# Patient Record
Sex: Female | Born: 1980 | Race: Black or African American | Hispanic: No | Smoking: Current every day smoker
Health system: Southern US, Community
[De-identification: ages and names within clinical notes are randomized; demographics above are authoritative.]

## PROBLEM LIST (undated history)

## (undated) DIAGNOSIS — R569 Unspecified convulsions: Secondary | ICD-10-CM

## (undated) DIAGNOSIS — D219 Benign neoplasm of connective and other soft tissue, unspecified: Secondary | ICD-10-CM

## (undated) DIAGNOSIS — N200 Calculus of kidney: Secondary | ICD-10-CM

## (undated) HISTORY — PX: TUBAL LIGATION: SHX77

## (undated) HISTORY — PX: KIDNEY STONE SURGERY: SHX686

## (undated) HISTORY — PX: INGUINAL HERNIA REPAIR: SHX194

---

## 2014-06-26 ENCOUNTER — Encounter (HOSPITAL_BASED_OUTPATIENT_CLINIC_OR_DEPARTMENT_OTHER): Payer: Self-pay

## 2014-06-26 ENCOUNTER — Emergency Department (HOSPITAL_BASED_OUTPATIENT_CLINIC_OR_DEPARTMENT_OTHER)
Admission: EM | Admit: 2014-06-26 | Discharge: 2014-06-26 | Disposition: A | Discharge status: Home / Self Care | Payer: Medicaid Other | Attending: Emergency Medicine | Admitting: Emergency Medicine

## 2014-06-26 ENCOUNTER — Emergency Department (HOSPITAL_BASED_OUTPATIENT_CLINIC_OR_DEPARTMENT_OTHER): Payer: Medicaid Other

## 2014-06-26 DIAGNOSIS — Z3202 Encounter for pregnancy test, result negative: Secondary | ICD-10-CM | POA: Insufficient documentation

## 2014-06-26 DIAGNOSIS — R112 Nausea with vomiting, unspecified: Secondary | ICD-10-CM | POA: Diagnosis not present

## 2014-06-26 DIAGNOSIS — Z86018 Personal history of other benign neoplasm: Secondary | ICD-10-CM | POA: Insufficient documentation

## 2014-06-26 DIAGNOSIS — B9689 Other specified bacterial agents as the cause of diseases classified elsewhere: Secondary | ICD-10-CM

## 2014-06-26 DIAGNOSIS — R1110 Vomiting, unspecified: Secondary | ICD-10-CM

## 2014-06-26 DIAGNOSIS — N3941 Urge incontinence: Secondary | ICD-10-CM

## 2014-06-26 DIAGNOSIS — N760 Acute vaginitis: Secondary | ICD-10-CM | POA: Insufficient documentation

## 2014-06-26 DIAGNOSIS — Z720 Tobacco use: Secondary | ICD-10-CM | POA: Insufficient documentation

## 2014-06-26 DIAGNOSIS — Z87442 Personal history of urinary calculi: Secondary | ICD-10-CM | POA: Insufficient documentation

## 2014-06-26 DIAGNOSIS — M545 Low back pain: Secondary | ICD-10-CM | POA: Insufficient documentation

## 2014-06-26 DIAGNOSIS — R1111 Vomiting without nausea: Secondary | ICD-10-CM

## 2014-06-26 HISTORY — DX: Calculus of kidney: N20.0

## 2014-06-26 HISTORY — DX: Benign neoplasm of connective and other soft tissue, unspecified: D21.9

## 2014-06-26 LAB — CBC WITH DIFFERENTIAL/PLATELET
BASOS PCT: 1 % (ref 0–1)
Basophils Absolute: 0 10*3/uL (ref 0.0–0.1)
Eosinophils Absolute: 0.1 10*3/uL (ref 0.0–0.7)
Eosinophils Relative: 1 % (ref 0–5)
HCT: 42.8 % (ref 36.0–46.0)
HEMOGLOBIN: 14.3 g/dL (ref 12.0–15.0)
Lymphocytes Relative: 27 % (ref 12–46)
Lymphs Abs: 1.8 10*3/uL (ref 0.7–4.0)
MCH: 29.6 pg (ref 26.0–34.0)
MCHC: 33.4 g/dL (ref 30.0–36.0)
MCV: 88.6 fL (ref 78.0–100.0)
Monocytes Absolute: 0.5 10*3/uL (ref 0.1–1.0)
Monocytes Relative: 7 % (ref 3–12)
NEUTROS ABS: 4.1 10*3/uL (ref 1.7–7.7)
NEUTROS PCT: 64 % (ref 43–77)
PLATELETS: 185 10*3/uL (ref 150–400)
RBC: 4.83 MIL/uL (ref 3.87–5.11)
RDW: 13.9 % (ref 11.5–15.5)
WBC: 6.5 10*3/uL (ref 4.0–10.5)

## 2014-06-26 LAB — PREGNANCY, URINE: Preg Test, Ur: NEGATIVE

## 2014-06-26 LAB — COMPREHENSIVE METABOLIC PANEL
ALT: 18 U/L (ref 0–35)
AST: 23 U/L (ref 0–37)
Albumin: 4 g/dL (ref 3.5–5.2)
Alkaline Phosphatase: 46 U/L (ref 39–117)
Anion gap: 6 (ref 5–15)
BUN: 14 mg/dL (ref 6–23)
CALCIUM: 8.8 mg/dL (ref 8.4–10.5)
CO2: 26 mmol/L (ref 19–32)
Chloride: 103 mmol/L (ref 96–112)
Creatinine, Ser: 0.84 mg/dL (ref 0.50–1.10)
GFR calc non Af Amer: >90 mL/min (ref >90)
GLUCOSE: 117 mg/dL — AB (ref 70–99)
Potassium: 4.1 mmol/L (ref 3.5–5.1)
Sodium: 135 mmol/L (ref 135–145)
TOTAL PROTEIN: 7.5 g/dL (ref 6.0–8.3)
Total Bilirubin: 0.9 mg/dL (ref 0.3–1.2)

## 2014-06-26 LAB — WET PREP, GENITAL
Trich, Wet Prep: NONE SEEN
Yeast Wet Prep HPF POC: NONE SEEN

## 2014-06-26 LAB — URINALYSIS, ROUTINE W REFLEX MICROSCOPIC
Glucose, UA: NEGATIVE mg/dL
Hgb urine dipstick: NEGATIVE
Ketones, ur: 15 mg/dL — AB
Leukocytes, UA: NEGATIVE
NITRITE: NEGATIVE
PH: 6 (ref 5.0–8.0)
Protein, ur: NEGATIVE mg/dL
Specific Gravity, Urine: 1.026 (ref 1.005–1.030)
Urobilinogen, UA: 1 mg/dL (ref 0.0–1.0)

## 2014-06-26 LAB — LIPASE, BLOOD: Lipase: 31 U/L (ref 11–59)

## 2014-06-26 MED ORDER — MORPHINE SULFATE 4 MG/ML IJ SOLN
4.0000 mg | INTRAMUSCULAR | Status: DC | PRN
  Administered 2014-06-26: 4 mg via INTRAVENOUS
  Filled 2014-06-26: qty 1

## 2014-06-26 MED ORDER — ONDANSETRON HCL 4 MG PO TABS: 4.0000 mg | ORAL_TABLET | Freq: Three times a day (TID) | ORAL | Status: DC | PRN

## 2014-06-26 MED ORDER — METRONIDAZOLE 500 MG PO TABS
500.0000 mg | ORAL_TABLET | Freq: Two times a day (BID) | ORAL | Status: DC
Start: 2014-06-26 — End: 2014-09-13

## 2014-06-26 MED ORDER — SODIUM CHLORIDE 0.9 % IV BOLUS (SEPSIS)
1000.0000 mL | Freq: Once | INTRAVENOUS | Status: AC
  Administered 2014-06-26: 1000 mL via INTRAVENOUS

## 2014-06-26 MED ORDER — ONDANSETRON HCL 4 MG/2ML IJ SOLN
4.0000 mg | Freq: Once | INTRAMUSCULAR | Status: AC
  Administered 2014-06-26: 4 mg via INTRAVENOUS
  Filled 2014-06-26: qty 2

## 2014-06-26 MED ORDER — FAMOTIDINE IN NACL 20-0.9 MG/50ML-% IV SOLN
20.0000 mg | Freq: Once | INTRAVENOUS | Status: AC
  Administered 2014-06-26: 20 mg via INTRAVENOUS
  Filled 2014-06-26: qty 50

## 2014-06-26 MED ORDER — HYDROCODONE-ACETAMINOPHEN 5-325 MG PO TABS: ORAL_TABLET | ORAL | Status: DC

## 2014-06-26 MED ORDER — METRONIDAZOLE 500 MG PO TABS
500.0000 mg | ORAL_TABLET | Freq: Once | ORAL | Status: AC
  Administered 2014-06-26: 500 mg via ORAL
  Filled 2014-06-26: qty 1

## 2014-06-26 NOTE — ED Provider Notes (Signed)
CSN: [id number]     Arrival date & time 06/26/14  1110 History   First MD Initiated Contact with Patient 06/26/14 1122     Chief Complaint  Patient presents with  . Emesis     (Consider location/radiation/quality/duration/timing/severity/associated sxs/prior Treatment) HPI   Tracey French is a 34 y.o. female complaining of episodes of nonbloody, nonbilious, non-coffee ground emesis starting yesterday in addition to urinary frequency, patient states that she caught to urine 3 times this morning. She states that she had the sensation of the urge to void but she couldn't get there in time. These had this issue with dribbling urine in the past but she's never completely lost control of her bladder before today. She has a history of fibroids, she had one surgery. She was advised that she would need to have a hysterectomy secondary to pressure that the the uterus was excerting on the pelvic floor. 3 vaginal deliveries with no other abdominal surgeries. Patient reports tactile fever last night however she took her temperature and it was normal. She is having normal bowel movements. On review of systems patient notes a severe back pain worsening over the course of one month, she states that she brought a new mattress and has been getting massages because the pain is so severe, she rates at 10 out of 10. It is nonradiating. Patient states that she just attributed to lifting and bending at work. She denies any history of IV drug use or cancer, saddle anesthesia.  Past Medical History  Diagnosis Date  . Fibroids   . Kidney stone    Past Surgical History  Procedure Laterality Date  . Kidney stone surgery    . Tubal ligation     No family history on file. History  Substance Use Topics  . Smoking status: Current Every Day Smoker  . Smokeless tobacco: Not on file  . Alcohol Use: No   OB History    No data available     Review of Systems  10 systems reviewed and found to be negative, except as  noted in the HPI.   Allergies  Codeine  Home Medications   Prior to Admission medications   Medication Sig Start Date End Date Taking? Authorizing Provider  HYDROcodone-acetaminophen (NORCO/VICODIN) 5-325 MG per tablet Take 1-2 tablets by mouth every 6 hours as needed for pain and/or cough. 06/26/14   Livier Hendel, PA-C  metroNIDAZOLE (FLAGYL) 500 MG tablet Take 1 tablet (500 mg total) by mouth 2 (two) times daily. One tab PO bid x 14 days 06/26/14   Elmyra Ricks Cailyn Houdek, PA-C  ondansetron (ZOFRAN) 4 MG tablet Take 1 tablet (4 mg total) by mouth every 8 (eight) hours as needed for nausea or vomiting. 06/26/14   Raiana Pharris, PA-C   BP 110/61 mmHg  Pulse 62  Temp(Src) 98.5 F (36.9 C) (Oral)  Resp 18  Ht 5\' 6"  (1.676 m)  Wt 128 lb (58.06 kg)  BMI 20.67 kg/m2  SpO2 100%  LMP 06/06/2014 (Approximate) Physical Exam  Constitutional: She is oriented to person, place, and time. She appears well-developed and well-nourished. No distress.  HENT:  Head: Normocephalic and atraumatic.  Mouth/Throat: Oropharynx is clear and moist.  Eyes: Conjunctivae and EOM are normal. Pupils are equal, round, and reactive to light.  Neck: Normal range of motion.  Cardiovascular: Normal rate, regular rhythm and intact distal pulses.   Pulmonary/Chest: Effort normal and breath sounds normal. No stridor. No respiratory distress. She has no wheezes. She has no rales. She exhibits  no tenderness.  Abdominal: Soft. Bowel sounds are normal. She exhibits no distension and no mass. There is no tenderness. There is no rebound and no guarding.  Genitourinary:  Pelvic exam is chaperoned by technician:   No rashes or lesions, no saddle anesthesia, patient has profuse thin, homogenous yellowish discharge. No cervical motion or adnexal tenderness.    Musculoskeletal: Normal range of motion. She exhibits no edema or tenderness.  No point tenderness to percussion of lumbar spinal processes.  No TTP or paraspinal  muscular spasm. Strength is 5 out of 5 to bilateral lower extremities at hip and knee; extensor hallucis longus 5 out of 5. Ankle strength 5 out of 5, no clonus, neurovascularly intact. No saddle anaesthesia. Patellar reflexes are 2+ bilaterally.    Rectal Tone normal.  Patient ambulates with a coordinated and nonantalgic gait.   Neurological: She is alert and oriented to person, place, and time.  Psychiatric: She has a normal mood and affect.  Nursing note and vitals reviewed.   ED Course  Procedures (including critical care time) Labs Review Labs Reviewed  WET PREP, GENITAL - Abnormal; Notable for the following:    Clue Cells Wet Prep HPF POC MODERATE (*)    WBC, Wet Prep HPF POC FEW (*)    All other components within normal limits  URINALYSIS, ROUTINE W REFLEX MICROSCOPIC - Abnormal; Notable for the following:    Bilirubin Urine SMALL (*)    Ketones, ur 15 (*)    All other components within normal limits  COMPREHENSIVE METABOLIC PANEL - Abnormal; Notable for the following:    Glucose, Bld 117 (*)    All other components within normal limits  PREGNANCY, URINE  CBC WITH DIFFERENTIAL/PLATELET  LIPASE, BLOOD  GC/CHLAMYDIA PROBE AMP ([place])    Imaging Review US Transvaginal Non-ob  06/26/2014   CLINICAL DATA:  Pelvic pain, history of fibroid uterus  EXAM: TRANSABDOMINAL AND TRANSVAGINAL ULTRASOUND OF PELVIS  TECHNIQUE: Both transabdominal and transvaginal ultrasound examinations of the pelvis were performed. Transabdominal technique was performed for global imaging of the pelvis including uterus, ovaries, adnexal regions, and pelvic cul-de-sac. It was necessary to proceed with endovaginal exam following the transabdominal exam to visualize the ovaries.  COMPARISON:  None  FINDINGS: Uterus  Measurements: 10 x 5.8 x 7 cm. No fibroids or other mass visualized.  Endometrium  Thickness: 7 mm.  No focal abnormality visualized.  Right ovary  Measurements: 3.1 x 2.1 x 2.7 cm. Normal  appearance/no adnexal mass.  Left ovary  Measurements: 3.8 x 2.8 x 2.5 cm. Normal appearance/no adnexal mass.  Other findings  No free fluid.  IMPRESSION: No acute abnormality noted.   Electronically Signed   By: Inez Catalina M.D.   On: 06/26/2014 12:51   US Pelvis Complete  06/26/2014   CLINICAL DATA:  Pelvic pain, history of fibroid uterus  EXAM: TRANSABDOMINAL AND TRANSVAGINAL ULTRASOUND OF PELVIS  TECHNIQUE: Both transabdominal and transvaginal ultrasound examinations of the pelvis were performed. Transabdominal technique was performed for global imaging of the pelvis including uterus, ovaries, adnexal regions, and pelvic cul-de-sac. It was necessary to proceed with endovaginal exam following the transabdominal exam to visualize the ovaries.  COMPARISON:  None  FINDINGS: Uterus  Measurements: 10 x 5.8 x 7 cm. No fibroids or other mass visualized.  Endometrium  Thickness: 7 mm.  No focal abnormality visualized.  Right ovary  Measurements: 3.1 x 2.1 x 2.7 cm. Normal appearance/no adnexal mass.  Left ovary  Measurements: 3.8 x 2.8 x  2.5 cm. Normal appearance/no adnexal mass.  Other findings  No free fluid.  IMPRESSION: No acute abnormality noted.   Electronically Signed   By: Inez Catalina M.D.   On: 06/26/2014 12:51     EKG Interpretation None      MDM   Final diagnoses:  Urge incontinence of urine  Non-intractable vomiting without nausea, vomiting of unspecified type  Low back pain, unspecified back pain laterality, with sciatica presence unspecified  Bacterial vaginosis    Filed Vitals:   06/26/14 1120 06/26/14 1440  BP: 111/71 110/61  Pulse: 70 62  Temp: 98.5 F (36.9 C)   TempSrc: Oral   Resp: 16 18  Height: 5\' 6"  (1.676 m)   Weight: 128 lb (58.06 kg)   SpO2: 96% 100%    Medications  morphine 4 MG/ML injection 4 mg (4 mg Intravenous Given 06/26/14 1320)  metroNIDAZOLE (FLAGYL) tablet 500 mg (not administered)  ondansetron (ZOFRAN) injection 4 mg (4 mg Intravenous Given 06/26/14  1134)  famotidine (PEPCID) IVPB 20 mg (0 mg Intravenous Stopped 06/26/14 1210)  sodium chloride 0.9 % bolus 1,000 mL (1,000 mLs Intravenous New Bag/Given 06/26/14 1319)    Tracey French is a pleasant 34 y.o. female presenting with several episodes of emesis, states that she not able to make it to the restroom and that she urinated on herself 3 times this morning. Neurologic exam is without abnormality, normal rectal tone, no saddle anesthesia. I doubt this is a cauda equina. Discussed case with attending physician who agrees, no MRI is warranted at this time. Patient has history of fibroids and was told that they are exerting pressure on the bladder. Patient did have the sensation that she needed to urinate. Patient reports urinary frequency, urinalysis is without signs of infection. Pelvic ultrasound with no acute abnormalities. Pelvic exam performed after patient returned from ultrasound and patient is reporting a perennial burning sensation. I don't see any lesions. No cervical motion or adnexal tenderness, patient does have a profuse homogenous yellow vaginal discharge. Clue cells are seen, I will treat her with Flagyl.  Postvoid residual <30cc Pt passed PO challenge.   Discussed case with attending MD who agrees with plan and stability to d/c to home.    Evaluation does not show pathology that would require ongoing emergent intervention or inpatient treatment. Pt is hemodynamically stable and mentating appropriately. Discussed findings and plan with patient/guardian, who agrees with care plan. All questions answered. Return precautions discussed and outpatient follow up given.   New Prescriptions   HYDROCODONE-ACETAMINOPHEN (NORCO/VICODIN) 5-325 MG PER TABLET    Take 1-2 tablets by mouth every 6 hours as needed for pain and/or cough.   METRONIDAZOLE (FLAGYL) 500 MG TABLET    Take 1 tablet (500 mg total) by mouth 2 (two) times daily. One tab PO bid x 14 days   ONDANSETRON (ZOFRAN) 4 MG TABLET     Take 1 tablet (4 mg total) by mouth every 8 (eight) hours as needed for nausea or vomiting.         Monico Blitz, PA-C 06/26/14 1442  Evelina Bucy, MD 06/26/14 1455

## 2014-06-26 NOTE — Discharge Instructions (Signed)
Push fluids: take small frequent sips of water or Gatorade, do not drink any soda, juice or caffeinated beverages.    Slowly resume solid diet as desired. Avoid food that are spicy, contain dairy and/or have high fat content.  Take vicodin for breakthrough pain, do not drink alcohol, drive, care for children or do other critical tasks while taking vicodin.  Do not hesitate to return to the emergency room for any new, worsening or concerning symptoms.  Please obtain primary care using resource guide below. But the minute you were seen in the emergency room and that they will need to obtain records for further outpatient management.   Back Pain, Adult Back pain is very common. The pain often gets better over time. The cause of back pain is usually not dangerous. Most people can learn to manage their back pain on their own.  HOME CARE   Stay active. Start with short walks on flat ground if you can. Try to walk farther each day.  Do not sit, drive, or stand in one place for more than 30 minutes. Do not stay in bed.  Do not avoid exercise or work. Activity can help your back heal faster.  Be careful when you bend or lift an object. Bend at your knees, keep the object close to you, and do not twist.  Sleep on a firm mattress. Lie on your side, and bend your knees. If you lie on your back, put a pillow under your knees.  Only take medicines as told by your doctor.  Put ice on the injured area.  Put ice in a plastic bag.  Place a towel between your skin and the bag.  Leave the ice on for 15-20 minutes, 03-04 times a day for the first 2 to 3 days. After that, you can switch between ice and heat packs.  Ask your doctor about back exercises or massage.  Avoid feeling anxious or stressed. Find good ways to deal with stress, such as exercise. GET HELP RIGHT AWAY IF:   Your pain does not go away with rest or medicine.  Your pain does not go away in 1 week.  You have new problems.  You  do not feel well.  The pain spreads into your legs.  You cannot control when you poop (bowel movement) or pee (urinate).  Your arms or legs feel weak or lose feeling (numbness).  You feel sick to your stomach (nauseous) or throw up (vomit).  You have belly (abdominal) pain.  You feel like you may pass out (faint). MAKE SURE YOU:   Understand these instructions.  Will watch your condition.  Will get help right away if you are not doing well or get worse. Document Released: 08/13/2007 Document Revised: 05/19/2011 Document Reviewed: 06/28/2013 [facility] Patient Information 2015 [place], [place]. This information is not intended to replace advice given to you by your health care provider. Make sure you discuss any questions you have with your health care provider.  Bacterial Vaginosis Bacterial vaginosis is a vaginal infection that occurs when the normal balance of bacteria in the vagina is disrupted. It results from an overgrowth of certain bacteria. This is the most common vaginal infection in women of childbearing age. Treatment is important to prevent complications, especially in pregnant women, as it can cause a premature delivery. CAUSES  Bacterial vaginosis is caused by an increase in harmful bacteria that are normally present in smaller amounts in the vagina. Several different kinds of bacteria can cause bacterial vaginosis.  However, the reason that the condition develops is not fully understood. RISK FACTORS Certain activities or behaviors can put you at an increased risk of developing bacterial vaginosis, including:  Having a new sex partner or multiple sex partners.  Douching.  Using an intrauterine device (IUD) for contraception. Women do not get bacterial vaginosis from toilet seats, bedding, swimming pools, or contact with objects around them. SIGNS AND SYMPTOMS  Some women with bacterial vaginosis have no signs or symptoms. Common symptoms include:  Grey vaginal  discharge.  A fishlike odor with discharge, especially after sexual intercourse.  Itching or burning of the vagina and vulva.  Burning or pain with urination. DIAGNOSIS  Your health care provider will take a medical history and examine the vagina for signs of bacterial vaginosis. A sample of vaginal fluid may be taken. Your health care provider will look at this sample under a microscope to check for bacteria and abnormal cells. A vaginal pH test may also be done.  TREATMENT  Bacterial vaginosis may be treated with antibiotic medicines. These may be given in the form of a pill or a vaginal cream. A second round of antibiotics may be prescribed if the condition comes back after treatment.  HOME CARE INSTRUCTIONS   Only take over-the-counter or prescription medicines as directed by your health care provider.  If antibiotic medicine was prescribed, take it as directed. Make sure you finish it even if you start to feel better.  Do not have sex until treatment is completed.  Tell all sexual partners that you have a vaginal infection. They should see their health care provider and be treated if they have problems, such as a mild rash or itching.  Practice safe sex by using condoms and only having one sex partner. SEEK MEDICAL CARE IF:   Your symptoms are not improving after 3 days of treatment.  You have increased discharge or pain.  You have a fever. MAKE SURE YOU:   Understand these instructions.  Will watch your condition.  Will get help right away if you are not doing well or get worse. FOR MORE INFORMATION  Centers for Disease Control and Prevention, Division of STD Prevention: AppraiserFraud.fi American Sexual Health Association (ASHA): www.ashastd.org  Document Released: 02/24/2005 Document Revised: 12/15/2012 Document Reviewed: 10/06/2012 [facility] Patient Information 2015 [place], [place]. This information is not intended to replace advice given to you by your health care  provider. Make sure you discuss any questions you have with your health care provider.   Emergency Department Resource Guide 1) Find a Doctor and Pay Out of Pocket Although you won't have to find out who is covered by your insurance plan, it is a good idea to ask around and get recommendations. You will then need to call the office and see if the doctor you have chosen will accept you as a new patient and what types of options they offer for patients who are self-pay. Some doctors offer discounts or will set up payment plans for their patients who do not have insurance, but you will need to ask so you aren't surprised when you get to your appointment.  2) Contact Your Local Health Department Not all health departments have doctors that can see patients for sick visits, but many do, so it is worth a call to see if yours does. If you don't know where your local health department is, you can check in your phone book. The CDC also has a tool to help you locate your state's health  department, and many state websites also have listings of all of their local health departments.  3) Find a [facility] If your illness is not likely to be very severe or complicated, you may want to try a walk in clinic. These are popping up all over the country in pharmacies, drugstores, and shopping centers. They're usually staffed by nurse practitioners or physician assistants that have been trained to treat common illnesses and complaints. They're usually fairly quick and inexpensive. However, if you have serious medical issues or chronic medical problems, these are probably not your best option.  No Primary Care Doctor: - Call Health Connect at  [phone] - they can help you locate a primary care doctor that  accepts your insurance, provides certain services, etc. - Physician Referral Service- [phone]  Chronic Pain Problems: Organization         Address  Phone   Notes  [facility]  [phone] Patients need to be referred by their primary care doctor.   Medication Assistance: Organization         Address  Phone   Notes  [facility] Medication [facility] [address] [place], [place] [address] [phone] --Must be a resident of [facility] -- Must have NO insurance coverage whatsoever (no Medicaid/ Medicare, etc.) -- The pt. MUST have a primary care doctor that directs their care regularly and follows them in the community   MedAssist  [phone]   [employer]  [phone]    Agencies that provide inexpensive medical care: Organization         Address  Phone   Notes  [place]  [phone]   [facility]    [phone]   [facility] [place], [place] [address] [phone]   [place] [address], [place] [phone]   Planned Parenthood    [phone]   [facility]    [phone]   [place] and [place] [address], [place] Phone:  [phone], Fax:  [phone] Hours of Operation:  9 am - 6 pm, M-F.  Also accepts Medicaid/Medicare and self-pay.  [facility] for [place] [address], [address], [place] Phone: [phone], Fax: [phone]. Hours of Operation:  8:30 am - 5:30 pm, M-F.  Also accepts Medicaid and self-pay.  [facility] [place] [address], [place] Phone: [phone]   [place], [place], [place] [phone], Ext. [phone] Mondays & Thursdays: 7-9 AM.  First 15 patients are seen on a first come, first serve basis.    [place] Providers:  Organization         Address  Phone   Notes  [facility] [address], [place] [phone] Also accepts self-pay patients.  [facility] [address], [place]   [phone]   [place], [address], [place] [phone]   [facility] [address] [place] [phone]   Lucianne Lei [address], [place]   [phone] Only accepts [place] Access [place] patients after they have their name applied to their card.   Self-Pay (no insurance) in [facility]:  Organization  Address  Phone   Notes  Sickle Cell Patients, [facility] [place] [phone]   [facility] Urgent Care [place] [phone]   [facility] [address], [address], [place] [phone]   [facility]/Dr. Osei-Bonsu  [address] [place] or [address], [place] [phone] Phone number for both [place] and [place] locations is the same.  Urgent Medical and [facility] [address], [place] [phone]   [facility] [address] [place] or [address] [phone]   [facility] [address] [place] [phone], phone; [phone], fax Sees patients 1st and 3rd Saturday of every month.  Must not qualify for public or private insurance (i.e. Medicaid, Medicare, [place] Health Choice, Veterans' Benefits)  Household income should be no more than 200% of the poverty level The clinic cannot treat you if you are pregnant or think you are pregnant  Sexually transmitted diseases are not treated at the clinic.    Dental Care: Organization         Address  Phone  Notes  [facility] Department of [facility] [place] [phone] Accepts children up to age 44 who are enrolled in [place] or [place]; pregnant women with a Medicaid card; and children who have applied for Medicaid or [place] Health Choice, but  were declined, whose parents can pay a reduced fee at time of service.  [facility] Department of [facility]  [address], [place] [phone] Accepts children up to age 53 who are enrolled in [place] or [place]; pregnant women with a Medicaid card; and children who have applied for Medicaid or [place] Health Choice, but were declined, whose parents can pay a reduced fee at time of service.  [place] Adult Dental Access PROGRAM  [place] [phone] Patients are seen by appointment only. Walk-ins are not accepted. [place] will see patients 35 years of age and older. Monday - Tuesday (8am-5pm) Most Wednesdays (8:30-5pm) $30 per visit, cash only  [facility] Adult Dental Access PROGRAM  [address], [facility] [phone] Patients are seen by appointment only. Walk-ins are not accepted. [place] will see patients 64 years of age and older. One Wednesday Evening (Monthly: Volunteer Based).  $30 per visit, cash only  [place]  [phone] for adults; Children under age 6, call Graduate Pediatric Dentistry at [phone]. Children aged 45-14, please call [phone] to request a pediatric application.  Dental services are provided in all areas of dental care including fillings, crowns and bridges, complete and partial dentures, implants, gum treatment, root canals, and extractions. Preventive care is also provided. Treatment is provided to both adults and children. Patients are selected via a lottery and there is often a waiting list.   [facility] [address] [place]  [phone] [email or web address]   [facility] [address] [place], [place] [phone], Ext. [phone] Second and Fourth Thursday of each month, opens at 6:30 AM; Clinic ends at 9 AM.  Patients are seen on a first-come first-served basis, and a limited number are seen during each clinic.    [facility]  [address] [place], [place]  [place], [place] [phone]   Eligibility Requirements You must have lived in [place], [place], or [place] counties for at least the last three months.   You cannot be eligible for state or federal sponsored [employer], including [employer], [place], or [employer].   You generally cannot be eligible for healthcare insurance through your employer.    How to apply: Eligibility screenings are held every Tuesday and Wednesday afternoon from 1:00 pm until 4:00 pm. You do not need an appointment for the interview!  [facility] [address] [place], [place]   [place]  [place] Department  [place]  [phone]    Behavioral Health Resources in the Community: Intensive Outpatient Programs Organization         Address  Phone  Notes  [place]. [address] [place], [place] [phone]   [facility] Outpatient [address], [place], [place]   ADS: Alcohol & Drug Svcs [address] [place] [address], [place], [place]   [place] [address],  [place], [place] or [phone]   Substance Abuse Resources Organization         Address  Phone  Notes  Alcohol and Drug Services  [phone]   [place]  [phone]   [facility] [place]   Chinita Pester  [phone]   Residential & Outpatient Substance Abuse Program  [phone]   Psychological Services Organization         Address  Phone  Notes  [facility] [place]  [place]  [phone]   [place] [address], [place] or [phone]    Mobile Crisis Teams Organization         Address  Phone  Notes  [facility], Mobile Crisis Care  Unit  [phone]   Assertive Psychotherapeutic Services  [address] [place], [place]   [place] [address] [place] [phone]    Self-Help/Support Groups Organization         Address  Phone             Notes  [place]. of [place] - variety of support groups  [place] Call for more information  Narcotics Anonymous (NA), Caring Services [address], [employer] [place]  2 meetings at this location   Special educational needs teacher         Address  Phone  Notes  ASAP Residential Treatment [place],    [place]  [phone]   [facility]  [address] [place] [address], [place], [place]   [place], [place] [phone] Admissions: 8am-3pm M-F  Incentives Substance [place] [address]    [place], [place] [phone]   [facility] [address] Jadene Pierini [place], [place]   [facility] [address]  [place], [place]   Insight Programs - Intensive Outpatient [address], [place], [place]   [facility] ([place] [address].,  [place], [place] or [phone]   [facility]idential Treatment Services (RTS) [address], [place], [place] Accepts Medicaid  [facility] [place] [address]  [place] [phone] Substance Abuse/Addiction Treatment   [facility] Resources Organization  Address  Phone  Notes  [facility]  [phone]   Domenic Schwab, PhD [address] [place], [place]   [phone] or [phone]   [place], [place] [phone]   [address], [place], [place] [phone] Insurance/Medicaid/sponsorship through [facility] Families [address] [place]                                     [place], [place] [phone] [place] [address], [place] [phone]    Dr. Adele Schilder  [phone]   [facility] [place] [facility] Dept. 1) [address], [place] 2) [place] 3)  [address], [place] [phone]  [phone]   [place] [phone] or [phone] (After Hours)

## 2014-06-26 NOTE — ED Notes (Signed)
C/o vomiting, "pressure in my bladder" with ?blood in urine

## 2014-06-26 NOTE — ED Notes (Signed)
PA at bedside.

## 2014-06-27 LAB — GC/CHLAMYDIA PROBE AMP (~~LOC~~) NOT AT ARMC
CHLAMYDIA, DNA PROBE: NEGATIVE
NEISSERIA GONORRHEA: NEGATIVE

## 2014-09-13 ENCOUNTER — Encounter (HOSPITAL_BASED_OUTPATIENT_CLINIC_OR_DEPARTMENT_OTHER): Payer: Self-pay | Admitting: *Deleted

## 2014-09-13 ENCOUNTER — Emergency Department (HOSPITAL_BASED_OUTPATIENT_CLINIC_OR_DEPARTMENT_OTHER): Payer: Medicaid Other

## 2014-09-13 ENCOUNTER — Emergency Department (HOSPITAL_BASED_OUTPATIENT_CLINIC_OR_DEPARTMENT_OTHER)
Admission: EM | Admit: 2014-09-13 | Discharge: 2014-09-13 | Disposition: A | Discharge status: Home / Self Care | Payer: Medicaid Other | Attending: Emergency Medicine | Admitting: Emergency Medicine

## 2014-09-13 DIAGNOSIS — S92531A Displaced fracture of distal phalanx of right lesser toe(s), initial encounter for closed fracture: Secondary | ICD-10-CM | POA: Insufficient documentation

## 2014-09-13 DIAGNOSIS — G40909 Epilepsy, unspecified, not intractable, without status epilepticus: Secondary | ICD-10-CM | POA: Diagnosis not present

## 2014-09-13 DIAGNOSIS — W51XXXA Accidental striking against or bumped into by another person, initial encounter: Secondary | ICD-10-CM | POA: Insufficient documentation

## 2014-09-13 DIAGNOSIS — Z87442 Personal history of urinary calculi: Secondary | ICD-10-CM | POA: Insufficient documentation

## 2014-09-13 DIAGNOSIS — S92911A Unspecified fracture of right toe(s), initial encounter for closed fracture: Secondary | ICD-10-CM

## 2014-09-13 DIAGNOSIS — Z86018 Personal history of other benign neoplasm: Secondary | ICD-10-CM | POA: Insufficient documentation

## 2014-09-13 DIAGNOSIS — Y9389 Activity, other specified: Secondary | ICD-10-CM | POA: Diagnosis not present

## 2014-09-13 DIAGNOSIS — Z720 Tobacco use: Secondary | ICD-10-CM | POA: Insufficient documentation

## 2014-09-13 DIAGNOSIS — Y998 Other external cause status: Secondary | ICD-10-CM | POA: Diagnosis not present

## 2014-09-13 DIAGNOSIS — Y9289 Other specified places as the place of occurrence of the external cause: Secondary | ICD-10-CM | POA: Diagnosis not present

## 2014-09-13 DIAGNOSIS — S99922A Unspecified injury of left foot, initial encounter: Secondary | ICD-10-CM | POA: Diagnosis present

## 2014-09-13 DIAGNOSIS — Z79899 Other long term (current) drug therapy: Secondary | ICD-10-CM | POA: Diagnosis not present

## 2014-09-13 HISTORY — DX: Unspecified convulsions: R56.9

## 2014-09-13 MED ORDER — NAPROXEN 500 MG PO TABS
500.0000 mg | ORAL_TABLET | Freq: Two times a day (BID) | ORAL | Status: DC
Start: 2014-09-13 — End: 2014-11-15

## 2014-09-13 NOTE — ED Provider Notes (Signed)
CSN: [id number]     Arrival date & time 09/13/14  1642 History   First MD Initiated Contact with Patient 09/13/14 1654     Chief Complaint  Patient presents with  . Toe Pain     (Consider location/radiation/quality/duration/timing/severity/associated sxs/prior Treatment) HPI Comments: Patient presents with complaint of right fourth toe redness and pain as well as swelling for the past 2 days after she kicked a piece of wood that was lying on the ground. After that time, she had her toe stepped on by her son. No treatments prior to arrival. Patient is able to ambulate but she is walking on her heel. No other injuries reported. Onset acute. Course is constant. Palpation and movement makes the pain worse. Nothing makes it better.  Patient is a 34 y.o. female presenting with toe pain. The history is provided by the patient.  Toe Pain Associated symptoms include arthralgias. Pertinent negatives include no joint swelling, neck pain, numbness or weakness.    Past Medical History  Diagnosis Date  . Fibroids   . Kidney stone   . Seizures    Past Surgical History  Procedure Laterality Date  . Kidney stone surgery    . Tubal ligation     No family history on file. History  Substance Use Topics  . Smoking status: Current Every Day Smoker  . Smokeless tobacco: Not on file  . Alcohol Use: No   OB History    No data available     Review of Systems  Constitutional: Negative for activity change.  Musculoskeletal: Positive for arthralgias and gait problem. Negative for back pain, joint swelling and neck pain.  Skin: Negative for wound.  Neurological: Negative for weakness and numbness.   Allergies  Codeine  Home Medications   Prior to Admission medications   Medication Sig Start Date End Date Taking? Authorizing Provider  divalproex (DEPAKOTE) 500 MG DR tablet Take 500 mg by mouth 2 (two) times daily.   Yes Historical Provider, MD  HYDROcodone-acetaminophen (NORCO/VICODIN) 5-325 MG  per tablet Take 1-2 tablets by mouth every 6 hours as needed for pain and/or cough. 06/26/14   Nicole Pisciotta, PA-C  metroNIDAZOLE (FLAGYL) 500 MG tablet Take 1 tablet (500 mg total) by mouth 2 (two) times daily. One tab PO bid x 14 days 06/26/14   Elmyra Ricks Pisciotta, PA-C  ondansetron (ZOFRAN) 4 MG tablet Take 1 tablet (4 mg total) by mouth every 8 (eight) hours as needed for nausea or vomiting. 06/26/14   Nicole Pisciotta, PA-C   BP 114/74 mmHg  Pulse 50  Temp(Src) 98.5 F (36.9 C) (Oral)  Resp 16  Ht 5\' 4"  (1.626 m)  Wt 124 lb (56.246 kg)  BMI 21.27 kg/m2  SpO2 98%  LMP 08/02/2014   Physical Exam  Constitutional: She appears well-developed and well-nourished.  HENT:  Head: Normocephalic and atraumatic.  Eyes: Pupils are equal, round, and reactive to light.  Neck: Normal range of motion. Neck supple.  Cardiovascular: Exam reveals no decreased pulses.   Musculoskeletal: She exhibits tenderness. She exhibits no edema.  Patient with tenderness with movement of her fourth right toe especially at the DIP joint. Patient does have some swelling distal to the PIP to the tip of the toe. Patient does not have any fluctuance suspicious for paronychia.  Neurological: She is alert. No sensory deficit.  Motor, sensation, and vascular distal to the injury is fully intact.   Skin: Skin is warm and dry.  Psychiatric: She has a normal mood and affect.  Nursing note and vitals reviewed.   ED Course  Procedures (including critical care time) Labs Review Labs Reviewed - No data to display  Imaging Review Dg Toe 4th Right  09/13/2014   CLINICAL DATA:  Right fourth toe pain and redness. The patient tripped over a piece of wood 2 days ago. Initial encounter.  EXAM: RIGHT FOURTH TOE  COMPARISON:  None.  FINDINGS: Cortical discontinuity is seen along the dorsal plate of the distal phalanx of the little toe suggesting incomplete avulsion fracture. Image bones otherwise appear normal. No soft tissue gas  collection or radiopaque foreign body is identified.  IMPRESSION: Findings suggestive of an incomplete dorsal plate avulsion fracture of the distal phalanx of the right fourth toe. The examination is otherwise negative.   Electronically Signed   By: Inge Rise M.D.   On: 09/13/2014 17:17     EKG Interpretation None       5:55 PM Patient seen and examined.    Vital signs reviewed and are as follows: BP 114/74 mmHg  Pulse 50  Temp(Src) 98.5 F (36.9 C) (Oral)  Resp 16  Ht 5\' 4"  (1.626 m)  Wt 124 lb (56.246 kg)  BMI 21.27 kg/m2  SpO2 98%  LMP 08/02/2014  Patient informed of x-ray results. Will buddy tape toe. Rice protocol indicated. She declines crutches. Discussed expected course of improvement.  MDM   Final diagnoses:  Fracture of toe of right foot, closed, initial encounter   Patient with minor fracture of toe. Toe is neurovascularly intact. There does not appear to be any infection at this time. No paronychia. No wound.   Carlisle Cater, PA-C 09/13/14 1807  Tanna Furry, MD 09/14/14 [phone]

## 2014-09-13 NOTE — ED Notes (Signed)
Pt sts she tripped over a piece of wood injuring her right 4th toe. She sts that after that, her son stepped on it.

## 2014-09-13 NOTE — Discharge Instructions (Signed)
Please read and follow all provided instructions.  Your diagnoses today include:  1. Fracture of toe of right foot, closed, initial encounter     Tests performed today include:  An x-ray of the affected area - shows a small chip off your toe  Vital signs. See below for your results today.   Medications prescribed:   Naproxen - anti-inflammatory pain medication  Do not exceed 500mg  naproxen every 12 hours, take with food  You have been prescribed an anti-inflammatory medication or NSAID. Take with food. Take smallest effective dose for the shortest duration needed for your pain. Stop taking if you experience stomach pain or vomiting.   Take any prescribed medications only as directed.  Home care instructions:   Follow any educational materials contained in this packet  Follow R.I.C.E. Protocol:  R - rest your injury   I  - use ice on injury without applying directly to skin  C - compress injury with bandage or splint  E - elevate the injury as much as possible  Follow-up instructions: Please follow-up with your primary care provider if you continue to have significant pain in 1 week. In this case you may have a more severe injury that requires further care.   Return instructions:   Please return if your toes or feet are numb or tingling, appear gray or blue, or you have severe pain (also elevate the leg and loosen splint or wrap if you were given one)  Please return to the Emergency Department if you experience worsening symptoms.   Please return if you have any other emergent concerns.  Additional Information:  Your vital signs today were: BP 114/74 mmHg   Pulse 50   Temp(Src) 98.5 F (36.9 C) (Oral)   Resp 16   Ht 5\' 4"  (1.626 m)   Wt 124 lb (56.246 kg)   BMI 21.27 kg/m2   SpO2 98%   LMP 08/02/2014 If your blood pressure (BP) was elevated above 135/85 this visit, please have this repeated by your doctor within one month. --------------

## 2014-11-15 ENCOUNTER — Emergency Department (HOSPITAL_BASED_OUTPATIENT_CLINIC_OR_DEPARTMENT_OTHER): Payer: Medicaid Other

## 2014-11-15 ENCOUNTER — Encounter (HOSPITAL_BASED_OUTPATIENT_CLINIC_OR_DEPARTMENT_OTHER): Payer: Self-pay

## 2014-11-15 ENCOUNTER — Emergency Department (HOSPITAL_BASED_OUTPATIENT_CLINIC_OR_DEPARTMENT_OTHER)
Admission: EM | Admit: 2014-11-15 | Discharge: 2014-11-15 | Disposition: A | Discharge status: Home / Self Care | Payer: Medicaid Other | Attending: Emergency Medicine | Admitting: Emergency Medicine

## 2014-11-15 DIAGNOSIS — Z87442 Personal history of urinary calculi: Secondary | ICD-10-CM | POA: Diagnosis not present

## 2014-11-15 DIAGNOSIS — Z720 Tobacco use: Secondary | ICD-10-CM | POA: Diagnosis not present

## 2014-11-15 DIAGNOSIS — R1030 Lower abdominal pain, unspecified: Secondary | ICD-10-CM

## 2014-11-15 DIAGNOSIS — Z3202 Encounter for pregnancy test, result negative: Secondary | ICD-10-CM | POA: Diagnosis not present

## 2014-11-15 DIAGNOSIS — M549 Dorsalgia, unspecified: Secondary | ICD-10-CM | POA: Insufficient documentation

## 2014-11-15 DIAGNOSIS — N939 Abnormal uterine and vaginal bleeding, unspecified: Secondary | ICD-10-CM | POA: Insufficient documentation

## 2014-11-15 DIAGNOSIS — Z86018 Personal history of other benign neoplasm: Secondary | ICD-10-CM | POA: Diagnosis not present

## 2014-11-15 DIAGNOSIS — R110 Nausea: Secondary | ICD-10-CM | POA: Diagnosis not present

## 2014-11-15 LAB — CBC WITH DIFFERENTIAL/PLATELET
Basophils Absolute: 0 10*3/uL (ref 0.0–0.1)
Basophils Relative: 0 % (ref 0–1)
EOS ABS: 0.1 10*3/uL (ref 0.0–0.7)
EOS PCT: 1 % (ref 0–5)
HCT: 43.3 % (ref 36.0–46.0)
Hemoglobin: 14.8 g/dL (ref 12.0–15.0)
LYMPHS ABS: 2.3 10*3/uL (ref 0.7–4.0)
Lymphocytes Relative: 32 % (ref 12–46)
MCH: 30.1 pg (ref 26.0–34.0)
MCHC: 34.2 g/dL (ref 30.0–36.0)
MCV: 88.2 fL (ref 78.0–100.0)
MONOS PCT: 6 % (ref 3–12)
Monocytes Absolute: 0.5 10*3/uL (ref 0.1–1.0)
Neutro Abs: 4.4 10*3/uL (ref 1.7–7.7)
Neutrophils Relative %: 61 % (ref 43–77)
PLATELETS: 197 10*3/uL (ref 150–400)
RBC: 4.91 MIL/uL (ref 3.87–5.11)
RDW: 13.7 % (ref 11.5–15.5)
WBC: 7.3 10*3/uL (ref 4.0–10.5)

## 2014-11-15 LAB — URINE MICROSCOPIC-ADD ON

## 2014-11-15 LAB — BASIC METABOLIC PANEL
Anion gap: 6 (ref 5–15)
BUN: 9 mg/dL (ref 6–20)
CHLORIDE: 104 mmol/L (ref 101–111)
CO2: 29 mmol/L (ref 22–32)
CREATININE: 0.75 mg/dL (ref 0.44–1.00)
Calcium: 8.9 mg/dL (ref 8.9–10.3)
GFR calc Af Amer: >60 mL/min (ref >60)
GFR calc non Af Amer: >60 mL/min (ref >60)
Glucose, Bld: 80 mg/dL (ref 65–99)
Potassium: 3.8 mmol/L (ref 3.5–5.1)
Sodium: 139 mmol/L (ref 135–145)

## 2014-11-15 LAB — URINALYSIS, ROUTINE W REFLEX MICROSCOPIC
Glucose, UA: NEGATIVE mg/dL
KETONES UR: 15 mg/dL — AB
NITRITE: NEGATIVE
PH: 5.5 (ref 5.0–8.0)
PROTEIN: 30 mg/dL — AB
Specific Gravity, Urine: 1.024 (ref 1.005–1.030)
Urobilinogen, UA: 1 mg/dL (ref 0.0–1.0)

## 2014-11-15 LAB — PREGNANCY, URINE: Preg Test, Ur: NEGATIVE

## 2014-11-15 MED ORDER — SODIUM CHLORIDE 0.9 % IV BOLUS (SEPSIS)
250.0000 mL | Freq: Once | INTRAVENOUS | Status: AC
  Administered 2014-11-15: 250 mL via INTRAVENOUS

## 2014-11-15 MED ORDER — NAPROXEN 500 MG PO TABS: 500.0000 mg | ORAL_TABLET | Freq: Two times a day (BID) | ORAL | Status: DC

## 2014-11-15 MED ORDER — ONDANSETRON HCL 4 MG/2ML IJ SOLN
4.0000 mg | Freq: Once | INTRAMUSCULAR | Status: AC
  Administered 2014-11-15: 4 mg via INTRAVENOUS
  Filled 2014-11-15: qty 2

## 2014-11-15 MED ORDER — SODIUM CHLORIDE 0.9 % IV SOLN
INTRAVENOUS | Status: DC
  Administered 2014-11-15: 13:00:00 via INTRAVENOUS

## 2014-11-15 MED ORDER — PROMETHAZINE HCL 25 MG PO TABS: 25.0000 mg | ORAL_TABLET | Freq: Four times a day (QID) | ORAL | Status: DC | PRN

## 2014-11-15 MED ORDER — IOHEXOL 300 MG/ML  SOLN
50.0000 mL | Freq: Once | INTRAMUSCULAR | Status: AC | PRN
  Administered 2014-11-15: 50 mL via ORAL

## 2014-11-15 MED ORDER — IOHEXOL 300 MG/ML  SOLN
100.0000 mL | Freq: Once | INTRAMUSCULAR | Status: AC | PRN
  Administered 2014-11-15: 100 mL via INTRAVENOUS

## 2014-11-15 NOTE — ED Provider Notes (Signed)
CSN: [id number]     Arrival date & time 11/15/14  1114 History   First MD Initiated Contact with Patient 11/15/14 1127     Chief Complaint  Patient presents with  . Vaginal Bleeding     (Consider location/radiation/quality/duration/timing/severity/associated sxs/prior Treatment) Patient is a 34 y.o. female presenting with vaginal bleeding. The history is provided by the patient.  Vaginal Bleeding Associated symptoms: abdominal pain, back pain and nausea   Associated symptoms: no dizziness, no dysuria, no fever and no vaginal discharge    patient with vaginal bleeding for 2 weeks. Just 2 weeks ago had some vaginal bleeding for short period of time. Patient has discomfort in the left lower quadrant of the abdomen radiates to the back also has some pain in the rectal area. Denies any fevers. Denies any lightheadedness or feeling like she's got pass out. Patient was seen by GYN 2 years ago and hysterectomy was recommended the exact reason for that is not clear.  Past Medical History  Diagnosis Date  . Fibroids   . Kidney stone   . Seizures    Past Surgical History  Procedure Laterality Date  . Kidney stone surgery    . Tubal ligation     No family history on file. Social History  Substance Use Topics  . Smoking status: Current Every Day Smoker  . Smokeless tobacco: None  . Alcohol Use: No   OB History    No data available     Review of Systems  Constitutional: Negative for fever.  HENT: Negative for congestion.   Eyes: Negative for redness.  Respiratory: Negative for shortness of breath.   Cardiovascular: Negative for chest pain.  Gastrointestinal: Positive for nausea and abdominal pain. Negative for vomiting.  Genitourinary: Positive for vaginal bleeding. Negative for dysuria and vaginal discharge.  Musculoskeletal: Positive for back pain.  Skin: Negative for rash.  Neurological: Negative for dizziness, light-headedness and headaches.  Hematological: Does not bruise/bleed  easily.  Psychiatric/Behavioral: Negative for confusion.      Allergies  Codeine  Home Medications   Prior to Admission medications   Not on File   BP 111/59 mmHg  Pulse 57  Temp(Src) 98.5 F (36.9 C) (Oral)  Resp 16  Ht 5\' 6"  (1.676 m)  Wt 125 lb (56.7 kg)  BMI 20.19 kg/m2  SpO2 100% Physical Exam  Constitutional: She is oriented to person, place, and time. She appears well-developed and well-nourished. No distress.  HENT:  Head: Normocephalic and atraumatic.  Mouth/Throat: Oropharynx is clear and moist.  Eyes: Conjunctivae and EOM are normal. Pupils are equal, round, and reactive to light.  Neck: Normal range of motion.  Cardiovascular: Normal rate, regular rhythm and normal heart sounds.   No murmur heard. Pulmonary/Chest: Effort normal and breath sounds normal. No respiratory distress.  Abdominal: Soft. Bowel sounds are normal. There is no tenderness.  Musculoskeletal: Normal range of motion.  Neurological: She is alert and oriented to person, place, and time. No cranial nerve deficit. She exhibits normal muscle tone. Coordination normal.  Skin: Skin is warm. No rash noted.  Nursing note and vitals reviewed.   ED Course  Procedures (including critical care time) Labs Review Labs Reviewed  URINALYSIS, ROUTINE W REFLEX MICROSCOPIC (NOT AT [facility]) - Abnormal; Notable for the following:    Color, Urine RED (*)    APPearance CLOUDY (*)    Hgb urine dipstick LARGE (*)    Bilirubin Urine SMALL (*)    Ketones, ur 15 (*)  Protein, ur 30 (*)    Leukocytes, UA SMALL (*)    All other components within normal limits  URINE MICROSCOPIC-ADD ON - Abnormal; Notable for the following:    Bacteria, UA FEW (*)    All other components within normal limits  PREGNANCY, URINE  BASIC METABOLIC PANEL  CBC WITH DIFFERENTIAL/PLATELET   Results for orders placed or performed during the hospital encounter of 11/15/14  Urinalysis, Routine w reflex microscopic (not at [facility])  Result  Value Ref Range   Color, Urine RED (A) YELLOW   APPearance CLOUDY (A) CLEAR   Specific Gravity, Urine 1.024 1.005 - 1.030   pH 5.5 5.0 - 8.0   Glucose, UA NEGATIVE NEGATIVE mg/dL   Hgb urine dipstick LARGE (A) NEGATIVE   Bilirubin Urine SMALL (A) NEGATIVE   Ketones, ur 15 (A) NEGATIVE mg/dL   Protein, ur 30 (A) NEGATIVE mg/dL   Urobilinogen, UA 1.0 0.0 - 1.0 mg/dL   Nitrite NEGATIVE NEGATIVE   Leukocytes, UA SMALL (A) NEGATIVE  Pregnancy, urine  Result Value Ref Range   Preg Test, Ur NEGATIVE NEGATIVE  Basic metabolic panel  Result Value Ref Range   Sodium 139 135 - 145 mmol/L   Potassium 3.8 3.5 - 5.1 mmol/L   Chloride 104 101 - 111 mmol/L   CO2 29 22 - 32 mmol/L   Glucose, Bld 80 65 - 99 mg/dL   BUN 9 6 - 20 mg/dL   Creatinine, Ser 0.75 0.44 - 1.00 mg/dL   Calcium 8.9 8.9 - 10.3 mg/dL   GFR calc non Af Amer >60 >60 mL/min   GFR calc Af Amer >60 >60 mL/min   Anion gap 6 5 - 15  CBC with Differential/Platelet  Result Value Ref Range   WBC 7.3 4.0 - 10.5 K/uL   RBC 4.91 3.87 - 5.11 MIL/uL   Hemoglobin 14.8 12.0 - 15.0 g/dL   HCT 43.3 36.0 - 46.0 %   MCV 88.2 78.0 - 100.0 fL   MCH 30.1 26.0 - 34.0 pg   MCHC 34.2 30.0 - 36.0 g/dL   RDW 13.7 11.5 - 15.5 %   Platelets 197 150 - 400 K/uL   Neutrophils Relative % 61 43 - 77 %   Neutro Abs 4.4 1.7 - 7.7 K/uL   Lymphocytes Relative 32 12 - 46 %   Lymphs Abs 2.3 0.7 - 4.0 K/uL   Monocytes Relative 6 3 - 12 %   Monocytes Absolute 0.5 0.1 - 1.0 K/uL   Eosinophils Relative 1 0 - 5 %   Eosinophils Absolute 0.1 0.0 - 0.7 K/uL   Basophils Relative 0 0 - 1 %   Basophils Absolute 0.0 0.0 - 0.1 K/uL  Urine microscopic-add on  Result Value Ref Range   Squamous Epithelial / LPF RARE RARE   WBC, UA 3-6 <3 WBC/hpf   RBC / HPF TOO NUMEROUS TO COUNT <3 RBC/hpf   Bacteria, UA FEW (A) RARE   Urine-Other MUCOUS PRESENT      Imaging Review Ct Abdomen Pelvis W Contrast  11/15/2014   CLINICAL DATA:  Hematuria and rectal pain. Abnormal  uterine bleeding. Symptoms for 2 weeks.  EXAM: CT ABDOMEN AND PELVIS WITH CONTRAST  TECHNIQUE: Multidetector CT imaging of the abdomen and pelvis was performed using the standard protocol following bolus administration of intravenous contrast.  CONTRAST:  50 mL OMNIPAQUE IOHEXOL 300 MG/ML SOLN, 100 mL OMNIPAQUE IOHEXOL 300 MG/ML SOLN  COMPARISON:  None.  FINDINGS: The lung bases are clear.  No pleural or pericardial effusion.  The gallbladder, liver, spleen, adrenal glands, pancreas and kidneys appear normal.  Uterus, adnexa and urinary bladder are unremarkable. The stomach, small and large bowel and appendix appear normal. There is no lymphadenopathy or fluid.  No bony abnormality is identified.  IMPRESSION: Negative CT abdomen and pelvis. No finding to explain the patient's symptoms.   Electronically Signed   By: Inge Rise M.D.   On: 11/15/2014 14:14   I have personally reviewed and evaluated these images and lab results as part of my medical decision-making.   EKG Interpretation None      MDM   Final diagnoses:  Lower abdominal pain  Abnormal vaginal bleeding    Patient with history of similar problems 2 years ago. Was recommended to have hysterectomy. CT scan though shows no abnormalities of the uterus and adnexa. Rest of the abdomen and pelvis was normal. Patient is having some abnormal vaginal bleeding. But no significant anemia. No leukocytosis no lab abnormalities. Pregnancy test is negative.  Would recommend follow-up with her OB/GYN doctor.    Fredia Sorrow, MD 11/15/14 [phone]

## 2014-11-15 NOTE — ED Notes (Signed)
Pt reports she had vaginal bleeding x 2 weeks-started again Sunday-also c/o blood in urine and rectal pain

## 2014-11-15 NOTE — Discharge Instructions (Signed)
Would recommend follow-up with your GYN Dr. for further evaluation. Today's workup including CT scan of the abdomen and pelvis without any significant abnormalities. Return for any new or worse symptoms. Take the hydrocodone as needed for pain. Take the Phenergan as needed for any nausea or vomiting.

## 2015-01-14 ENCOUNTER — Emergency Department (HOSPITAL_BASED_OUTPATIENT_CLINIC_OR_DEPARTMENT_OTHER): Payer: Medicaid Other

## 2015-01-14 ENCOUNTER — Encounter (HOSPITAL_BASED_OUTPATIENT_CLINIC_OR_DEPARTMENT_OTHER): Payer: Self-pay | Admitting: Emergency Medicine

## 2015-01-14 ENCOUNTER — Emergency Department (HOSPITAL_BASED_OUTPATIENT_CLINIC_OR_DEPARTMENT_OTHER)
Admission: EM | Admit: 2015-01-14 | Discharge: 2015-01-14 | Disposition: A | Discharge status: Home / Self Care | Payer: Medicaid Other | Attending: Emergency Medicine | Admitting: Emergency Medicine

## 2015-01-14 DIAGNOSIS — M545 Low back pain: Secondary | ICD-10-CM | POA: Insufficient documentation

## 2015-01-14 DIAGNOSIS — R319 Hematuria, unspecified: Secondary | ICD-10-CM | POA: Insufficient documentation

## 2015-01-14 DIAGNOSIS — R300 Dysuria: Secondary | ICD-10-CM | POA: Insufficient documentation

## 2015-01-14 DIAGNOSIS — Z791 Long term (current) use of non-steroidal anti-inflammatories (NSAID): Secondary | ICD-10-CM | POA: Insufficient documentation

## 2015-01-14 DIAGNOSIS — Z87442 Personal history of urinary calculi: Secondary | ICD-10-CM | POA: Insufficient documentation

## 2015-01-14 DIAGNOSIS — M5416 Radiculopathy, lumbar region: Secondary | ICD-10-CM

## 2015-01-14 DIAGNOSIS — R197 Diarrhea, unspecified: Secondary | ICD-10-CM | POA: Insufficient documentation

## 2015-01-14 DIAGNOSIS — Z3202 Encounter for pregnancy test, result negative: Secondary | ICD-10-CM | POA: Insufficient documentation

## 2015-01-14 DIAGNOSIS — Z720 Tobacco use: Secondary | ICD-10-CM | POA: Insufficient documentation

## 2015-01-14 DIAGNOSIS — Z86018 Personal history of other benign neoplasm: Secondary | ICD-10-CM | POA: Insufficient documentation

## 2015-01-14 LAB — URINALYSIS, ROUTINE W REFLEX MICROSCOPIC
Bilirubin Urine: NEGATIVE
Glucose, UA: NEGATIVE mg/dL
Ketones, ur: NEGATIVE mg/dL
LEUKOCYTES UA: NEGATIVE
Nitrite: NEGATIVE
Protein, ur: NEGATIVE mg/dL
SPECIFIC GRAVITY, URINE: 1.011 (ref 1.005–1.030)
UROBILINOGEN UA: 0.2 mg/dL (ref 0.0–1.0)
pH: 6.5 (ref 5.0–8.0)

## 2015-01-14 LAB — COMPREHENSIVE METABOLIC PANEL
ALBUMIN: 3.3 g/dL — AB (ref 3.5–5.0)
ALK PHOS: 36 U/L — AB (ref 38–126)
ALT: 11 U/L — ABNORMAL LOW (ref 14–54)
ANION GAP: 7 (ref 5–15)
AST: 18 U/L (ref 15–41)
BILIRUBIN TOTAL: 0.5 mg/dL (ref 0.3–1.2)
BUN: 11 mg/dL (ref 6–20)
CALCIUM: 8.5 mg/dL — AB (ref 8.9–10.3)
CO2: 25 mmol/L (ref 22–32)
Chloride: 105 mmol/L (ref 101–111)
Creatinine, Ser: 0.66 mg/dL (ref 0.44–1.00)
GFR calc Af Amer: >60 mL/min (ref >60)
GFR calc non Af Amer: >60 mL/min (ref >60)
Glucose, Bld: 145 mg/dL — ABNORMAL HIGH (ref 65–99)
Potassium: 3.4 mmol/L — ABNORMAL LOW (ref 3.5–5.1)
SODIUM: 137 mmol/L (ref 135–145)
TOTAL PROTEIN: 6.5 g/dL (ref 6.5–8.1)

## 2015-01-14 LAB — CBC WITH DIFFERENTIAL/PLATELET
BASOS ABS: 0 10*3/uL (ref 0.0–0.1)
Basophils Relative: 0 %
EOS PCT: 1 %
Eosinophils Absolute: 0.1 10*3/uL (ref 0.0–0.7)
HEMATOCRIT: 39.3 % (ref 36.0–46.0)
HEMOGLOBIN: 13.3 g/dL (ref 12.0–15.0)
LYMPHS ABS: 2.1 10*3/uL (ref 0.7–4.0)
LYMPHS PCT: 22 %
MCH: 30 pg (ref 26.0–34.0)
MCHC: 33.8 g/dL (ref 30.0–36.0)
MCV: 88.5 fL (ref 78.0–100.0)
Monocytes Absolute: 0.6 10*3/uL (ref 0.1–1.0)
Monocytes Relative: 6 %
NEUTROS ABS: 6.6 10*3/uL (ref 1.7–7.7)
Neutrophils Relative %: 71 %
Platelets: 220 10*3/uL (ref 150–400)
RBC: 4.44 MIL/uL (ref 3.87–5.11)
RDW: 13.5 % (ref 11.5–15.5)
WBC: 9.4 10*3/uL (ref 4.0–10.5)

## 2015-01-14 LAB — URINE MICROSCOPIC-ADD ON

## 2015-01-14 LAB — PREGNANCY, URINE: Preg Test, Ur: NEGATIVE

## 2015-01-14 MED ORDER — HYDROCODONE-ACETAMINOPHEN 5-325 MG PO TABS: 1.0000 | ORAL_TABLET | ORAL | Status: DC | PRN

## 2015-01-14 MED ORDER — KETOROLAC TROMETHAMINE 30 MG/ML IJ SOLN
30.0000 mg | Freq: Once | INTRAMUSCULAR | Status: AC
  Administered 2015-01-14: 30 mg via INTRAVENOUS
  Filled 2015-01-14: qty 1

## 2015-01-14 MED ORDER — KETOROLAC TROMETHAMINE 60 MG/2ML IM SOLN: 60.0000 mg | Freq: Once | INTRAMUSCULAR | Status: DC

## 2015-01-14 MED ORDER — IBUPROFEN 600 MG PO TABS: 600.0000 mg | ORAL_TABLET | Freq: Four times a day (QID) | ORAL | Status: DC | PRN

## 2015-01-14 MED ORDER — METHOCARBAMOL 500 MG PO TABS: 500.0000 mg | ORAL_TABLET | Freq: Three times a day (TID) | ORAL | Status: DC | PRN

## 2015-01-14 MED ORDER — METHOCARBAMOL 500 MG PO TABS
1000.0000 mg | ORAL_TABLET | Freq: Once | ORAL | Status: AC
  Administered 2015-01-14: 1000 mg via ORAL
  Filled 2015-01-14: qty 2

## 2015-01-14 NOTE — Discharge Instructions (Signed)
Lumbosacral Radiculopathy °Lumbosacral radiculopathy is a condition that involves the spinal nerves and nerve roots in the low back and bottom of the spine. The condition develops when these nerves and nerve roots move out of place or become inflamed and cause symptoms. °CAUSES °This condition may be caused by: °· Pressure from a disk that bulges out of place (herniated disk). A disk is a plate of cartilage that separates bones in the spine. °· Disk degeneration. °· A narrowing of the bones of the lower back (spinal stenosis). °· A tumor. °· An infection. °· An injury that places sudden pressure on the disks that cushion the bones of your lower spine. °RISK FACTORS °This condition is more likely to develop in: °· Males aged 30-50 years. °· Females aged 50-60 years. °· People who lift improperly. °· People who are overweight or live a sedentary lifestyle. °· People who smoke. °· People who perform repetitive activities that strain the spine. °SYMPTOMS °Symptoms of this condition include: °· Pain that goes down from the back into the legs (sciatica). This is the most common symptom. The pain may be worse with sitting, coughing, or sneezing. °· Pain and numbness in the arms and legs. °· Muscle weakness. °· Tingling. °· Loss of bladder control or bowel control. °DIAGNOSIS °This condition is diagnosed with a physical exam and medical history. If the pain is lasting, you may have tests, such as: °· MRI scan. °· X-ray. °· CT scan. °· Myelogram. °· Nerve conduction study. °TREATMENT °This condition is often treated with: °· Hot packs and ice applied to affected areas. °· Stretches to improve flexibility. °· Exercises to strengthen back muscles. °· Physical therapy. °· Pain medicine. °· A steroid injection in the spine. °In some cases, no treatment is needed. If the condition is long-lasting (chronic), or if symptoms are severe, treatment may involve surgery or lifestyle changes, such as following a weight loss plan. °HOME  CARE INSTRUCTIONS °Medicines °· Take medicines only as directed by your health care provider. °· Do not drive or operate heavy machinery while taking pain medicine. °Injury Care °· Apply a heat pack to the injured area as directed by your health care provider. °· Apply ice to the affected area: °¨ Put ice in a plastic bag. °¨ Place a towel between your skin and the bag. °¨ Leave the ice on for 20-30 minutes, every 2 hours while you are awake or as needed. Or, leave the ice on for as long as directed by your health care provider. °Other Instructions °· If you were shown how to do any exercises or stretches, do them as directed by your health care provider. °· If your health care provider prescribed a diet or exercise program, follow it as directed. °· Keep all follow-up visits as directed by your health care provider. This is important. °SEEK MEDICAL CARE IF: °· Your pain does not improve over time even when taking pain medicines. °SEEK IMMEDIATE MEDICAL CARE IF: °· Your develop severe pain. °· Your pain suddenly gets worse. °· You develop increasing weakness in your legs. °· You lose the ability to control your bladder or bowel. °· You have difficulty walking or balancing. °· You have a fever. °  °This information is not intended to replace advice given to you by your health care provider. Make sure you discuss any questions you have with your health care provider. °  °Document Released: 02/24/2005 Document Revised: 07/11/2014 Document Reviewed: 02/20/2014 °Elsevier Interactive Patient Education ©2016 [employer] ° °

## 2015-01-14 NOTE — ED Notes (Signed)
Pt returned from CT °

## 2015-01-14 NOTE — ED Provider Notes (Signed)
CSN: [id number]     Arrival date & time 01/14/15  0149 History   First MD Initiated Contact with Patient 01/14/15 0215     Chief Complaint  Patient presents with  . Flank Pain     (Consider location/radiation/quality/duration/timing/severity/associated sxs/prior Treatment) HPI Patient presents with gradual onset left flank pain for the past 3 days. She states is associated with tingling down the left leg all the way to the foot. No nausea or vomiting. She also has had several bouts of loose stools. Denies any abdominal pain. Patient with mild burning at the end of urination. States she has noticed some blood in her urine and increased frequency. No fever or chills. No focal weakness or numbness. No incontinence. Past Medical History  Diagnosis Date  . Fibroids   . Kidney stone   . Seizures [facility])    Past Surgical History  Procedure Laterality Date  . Kidney stone surgery    . Tubal ligation     History reviewed. No pertinent family history. Social History  Substance Use Topics  . Smoking status: Current Every Day Smoker  . Smokeless tobacco: None  . Alcohol Use: No   OB History    No data available     Review of Systems  Constitutional: Negative for fever and chills.  Respiratory: Negative for shortness of breath.   Cardiovascular: Negative for chest pain, palpitations and leg swelling.  Gastrointestinal: Positive for diarrhea. Negative for nausea, vomiting and abdominal pain.  Genitourinary: Positive for dysuria, frequency, hematuria and flank pain. Negative for difficulty urinating and pelvic pain.  Musculoskeletal: Positive for myalgias and back pain. Negative for neck pain and neck stiffness.  Skin: Negative for rash and wound.  Neurological: Negative for dizziness, weakness, light-headedness, numbness and headaches.  All other systems reviewed and are negative.     Allergies  Codeine  Home Medications   Prior to Admission medications   Medication Sig Start  Date End Date Taking? Authorizing Provider  naproxen (NAPROSYN) 500 MG tablet Take 1 tablet (500 mg total) by mouth 2 (two) times daily. 11/15/14  Yes Fredia Sorrow, MD  promethazine (PHENERGAN) 25 MG tablet Take 1 tablet (25 mg total) by mouth every 6 (six) hours as needed for nausea or vomiting. 11/15/14  Yes Fredia Sorrow, MD  HYDROcodone-acetaminophen (NORCO) 5-325 MG tablet Take 1-2 tablets by mouth every 4 (four) hours as needed for severe pain. 01/14/15   Julianne Rice, MD  ibuprofen (ADVIL,MOTRIN) 600 MG tablet Take 1 tablet (600 mg total) by mouth every 6 (six) hours as needed. 01/14/15   Julianne Rice, MD  methocarbamol (ROBAXIN) 500 MG tablet Take 1 tablet (500 mg total) by mouth every 8 (eight) hours as needed for muscle spasms. 01/14/15   Julianne Rice, MD   BP 91/55 mmHg  Pulse 67  Temp(Src) 98 F (36.7 C) (Oral)  Resp 20  Ht 5\' 5"  (1.651 m)  Wt 128 lb (58.06 kg)  BMI 21.30 kg/m2  SpO2 98%  LMP 01/05/2015 Physical Exam  Constitutional: She is oriented to person, place, and time. She appears well-developed and well-nourished. No distress.  HENT:  Head: Normocephalic and atraumatic.  Mouth/Throat: Oropharynx is clear and moist.  Eyes: EOM are normal. Pupils are equal, round, and reactive to light.  Neck: Normal range of motion. Neck supple.  Cardiovascular: Normal rate and regular rhythm.   Pulmonary/Chest: Effort normal and breath sounds normal. No respiratory distress. She has no wheezes. She has no rales. She exhibits no tenderness.  Abdominal:  Soft. Bowel sounds are normal. She exhibits no distension and no mass. There is no tenderness. There is no rebound and no guarding.  Musculoskeletal: Normal range of motion. She exhibits tenderness (patient with left flank tenderness to percussion. No midline thoracic or lumbar tenderness.). She exhibits no edema.  Positive straight-leg raise on the left. Distal pulses intact. No calf swelling or tenderness.  Neurological: She is  alert and oriented to person, place, and time.  5/5 motor in all extremities. Ambulating without difficulty. No saddle anesthesia. Sensation fully intact.  Skin: Skin is warm and dry. No rash noted. No erythema.  Psychiatric: She has a normal mood and affect. Her behavior is normal.  Nursing note and vitals reviewed.   ED Course  Procedures (including critical care time) Labs Review Labs Reviewed  URINALYSIS, ROUTINE W REFLEX MICROSCOPIC (NOT AT [facility]) - Abnormal; Notable for the following:    Hgb urine dipstick MODERATE (*)    All other components within normal limits  COMPREHENSIVE METABOLIC PANEL - Abnormal; Notable for the following:    Potassium 3.4 (*)    Glucose, Bld 145 (*)    Calcium 8.5 (*)    Albumin 3.3 (*)    ALT 11 (*)    Alkaline Phosphatase 36 (*)    All other components within normal limits  URINE MICROSCOPIC-ADD ON - Abnormal; Notable for the following:    Bacteria, UA FEW (*)    All other components within normal limits  CBC WITH DIFFERENTIAL/PLATELET  PREGNANCY, URINE    Imaging Review Ct Abdomen Pelvis Wo Contrast  01/14/2015  CLINICAL DATA:  Left flank pain for 3-4 days. EXAM: CT ABDOMEN AND PELVIS WITHOUT CONTRAST TECHNIQUE: Multidetector CT imaging of the abdomen and pelvis was performed following the standard protocol without IV contrast. COMPARISON:  11/15/2014 FINDINGS: There is no urinary calculus. There is no hydronephrosis or ureteral dilatation. There are unremarkable unenhanced appearances of the liver, spleen, pancreas, adrenals and kidneys. There are normal appearances of the stomach, small bowel and colon. The appendix is normal. The abdominal aorta is normal in caliber. There is no atherosclerotic calcification. There is no adenopathy in the abdomen or pelvis. The uterus and ovaries appear unremarkable. No acute inflammatory changes are evident in the abdomen or pelvis. There is no ascites. There is a small fat containing umbilical hernia. There is no  significant abnormality in the lower chest. There is no significant musculoskeletal abnormality. IMPRESSION: 1. No acute findings are evident in the abdomen or pelvis. 2. Fat containing umbilical hernia Electronically Signed   By: Andreas Newport M.D.   On: 01/14/2015 04:24   I have personally reviewed and evaluated these images and lab results as part of my medical decision-making.   EKG Interpretation None      MDM   Final diagnoses:  Lumbar radiculopathy    Patient presents with gradual onset left back pain radiating down the left leg. No red flag signs or symptoms.  Patient is resting comfortably. Workup essentially negative. We'll treat symptomatically. Given referral for neurosurgery. Return precautions given.  Julianne Rice, MD 01/14/15 [phone]

## 2015-01-14 NOTE — ED Notes (Signed)
Patient transported to CT 

## 2015-02-11 ENCOUNTER — Emergency Department (HOSPITAL_BASED_OUTPATIENT_CLINIC_OR_DEPARTMENT_OTHER)
Admission: EM | Admit: 2015-02-11 | Discharge: 2015-02-11 | Disposition: A | Discharge status: Home / Self Care | Payer: Medicaid Other | Attending: Emergency Medicine | Admitting: Emergency Medicine

## 2015-02-11 ENCOUNTER — Encounter (HOSPITAL_BASED_OUTPATIENT_CLINIC_OR_DEPARTMENT_OTHER): Payer: Self-pay | Admitting: Emergency Medicine

## 2015-02-11 DIAGNOSIS — Z86018 Personal history of other benign neoplasm: Secondary | ICD-10-CM | POA: Insufficient documentation

## 2015-02-11 DIAGNOSIS — Z791 Long term (current) use of non-steroidal anti-inflammatories (NSAID): Secondary | ICD-10-CM | POA: Insufficient documentation

## 2015-02-11 DIAGNOSIS — L0201 Cutaneous abscess of face: Secondary | ICD-10-CM | POA: Insufficient documentation

## 2015-02-11 DIAGNOSIS — L0291 Cutaneous abscess, unspecified: Secondary | ICD-10-CM

## 2015-02-11 DIAGNOSIS — F17210 Nicotine dependence, cigarettes, uncomplicated: Secondary | ICD-10-CM | POA: Insufficient documentation

## 2015-02-11 DIAGNOSIS — Z87442 Personal history of urinary calculi: Secondary | ICD-10-CM | POA: Insufficient documentation

## 2015-02-11 MED ORDER — LIDOCAINE-EPINEPHRINE 1 %-1:100000 IJ SOLN: 5.0000 mL | Freq: Once | INTRAMUSCULAR | Status: DC

## 2015-02-11 MED ORDER — PENTAFLUOROPROP-TETRAFLUOROETH EX AERO
INHALATION_SPRAY | CUTANEOUS | Status: AC
  Filled 2015-02-11: qty 30

## 2015-02-11 MED ORDER — LIDOCAINE-EPINEPHRINE-TETRACAINE (LET) SOLUTION
3.0000 mL | Freq: Once | NASAL | Status: AC
  Administered 2015-02-11: 3 mL via TOPICAL
  Filled 2015-02-11: qty 3

## 2015-02-11 MED ORDER — LIDOCAINE HCL (PF) 1 % IJ SOLN
INTRAMUSCULAR | Status: DC
Start: 2015-02-11 — End: 2015-02-11
  Filled 2015-02-11: qty 5

## 2015-02-11 NOTE — Discharge Instructions (Signed)

## 2015-02-11 NOTE — ED Provider Notes (Signed)
CSN: [id number]     Arrival date & time 02/11/15  1155 History   First MD Initiated Contact with Patient 02/11/15 1230     Chief Complaint  Patient presents with  . Abscess     (Consider location/radiation/quality/duration/timing/severity/associated sxs/prior Treatment) HPI Patient has an area in front of her left ear that started like a itchy pimple about 9 days ago. She reports over the last several days now it got much larger and more tender. It even hurts a little bit into her ear. She reports she wears a headset at work and that is been putting pressure on it and making it worse. No other associated symptoms. Past Medical History  Diagnosis Date  . Fibroids   . Kidney stone   . Seizures [facility])    Past Surgical History  Procedure Laterality Date  . Kidney stone surgery    . Tubal ligation     History reviewed. No pertinent family history. Social History  Substance Use Topics  . Smoking status: Current Every Day Smoker -- 0.50 packs/day    Types: Cigarettes  . Smokeless tobacco: None  . Alcohol Use: No   OB History    No data available     Review of Systems Constitutional: No fevers no chills.   Allergies  Codeine  Home Medications   Prior to Admission medications   Medication Sig Start Date End Date Taking? Authorizing Provider  HYDROcodone-acetaminophen (NORCO) 5-325 MG tablet Take 1-2 tablets by mouth every 4 (four) hours as needed for severe pain. 01/14/15   Julianne Rice, MD  ibuprofen (ADVIL,MOTRIN) 600 MG tablet Take 1 tablet (600 mg total) by mouth every 6 (six) hours as needed. 01/14/15   Julianne Rice, MD  methocarbamol (ROBAXIN) 500 MG tablet Take 1 tablet (500 mg total) by mouth every 8 (eight) hours as needed for muscle spasms. 01/14/15   Julianne Rice, MD  naproxen (NAPROSYN) 500 MG tablet Take 1 tablet (500 mg total) by mouth 2 (two) times daily. 11/15/14   Fredia Sorrow, MD  promethazine (PHENERGAN) 25 MG tablet Take 1 tablet (25 mg total) by  mouth every 6 (six) hours as needed for nausea or vomiting. 11/15/14   Fredia Sorrow, MD   BP 98/74 mmHg  Pulse 58  Temp(Src) 98.3 F (36.8 C) (Oral)  Resp 16  Ht 5\' 5"  (1.651 m)  Wt 129 lb (58.514 kg)  BMI 21.47 kg/m2  SpO2 100%  LMP 02/05/2015 Physical Exam  Constitutional: She appears well-developed and well-nourished.  HENT:  Bilateral TMs are normal without erythema or bulging. The left ear canal is patent. There is no posterior auricular lymphadenopathy or inflammation. There is a of focal fluctuant nodule anterior to the tragus that is approximately 1 cm. Normal range of motion of the jaw with no trismus. No other associated facial swelling or cellulitis. Neck is supple.  Neck: Neck supple.  Pulmonary/Chest: Effort normal.  Neurological: She is alert. No cranial nerve deficit.  Skin: Skin is warm and dry.  Psychiatric: She has a normal mood and affect.      ED Course  .Marland KitchenIncision and Drainage Date/Time: 02/11/2015 2:20 PM Performed by: Charlesetta Shanks Authorized by: Charlesetta Shanks Type: abscess Body area: head Location details: face Anesthesia: local infiltration Local anesthetic: lidocaine 1% with epinephrine Scalpel size: 11 Needle gauge: 18 Incision type: single straight Incision depth: subcutaneous Complexity: simple Drainage: purulent Drainage amount: copious Wound treatment: wound left open Packing material: none Comments: Site was first prepped with iodoform and 18-gauge used  to aspirate and confirm pus. Thick purulent material present. Linear incision made vertically. With pressure copious amount of caseous and purulent material is expressed. This was subsequently cleaned and antibiotic ointment and dressing applied.   (including critical care time)  Labs Review Labs Reviewed - No data to display  Imaging Review No results found. I have personally reviewed and evaluated these images and lab results as part of my medical decision-making.   EKG  Interpretation None      MDM   Final diagnoses:  Abscess   Patient presents with a structure anterior to her ear that appears to be cystic with purulent contents and secondary infection. It grew quickly over about a 2 day. At this point has been drained and there is no associated facial cellulitis or lymphadenopathy. I did not feel that empiric antibodies are needed at this point time she has no other signs of trismus or deep space facial infection.     Charlesetta Shanks, MD 02/11/15 [phone]

## 2015-02-11 NOTE — ED Notes (Signed)
Pt noticed bump to medial ear on thanksgiving, area has increased since, now pt with abscess like area

## 2015-03-06 ENCOUNTER — Emergency Department (HOSPITAL_BASED_OUTPATIENT_CLINIC_OR_DEPARTMENT_OTHER)
Admission: EM | Admit: 2015-03-06 | Discharge: 2015-03-06 | Disposition: A | Discharge status: Home / Self Care | Payer: Medicaid Other | Attending: Emergency Medicine | Admitting: Emergency Medicine

## 2015-03-06 ENCOUNTER — Encounter (HOSPITAL_BASED_OUTPATIENT_CLINIC_OR_DEPARTMENT_OTHER): Payer: Self-pay

## 2015-03-06 DIAGNOSIS — N760 Acute vaginitis: Secondary | ICD-10-CM | POA: Insufficient documentation

## 2015-03-06 DIAGNOSIS — R471 Dysarthria and anarthria: Secondary | ICD-10-CM | POA: Insufficient documentation

## 2015-03-06 DIAGNOSIS — Z791 Long term (current) use of non-steroidal anti-inflammatories (NSAID): Secondary | ICD-10-CM | POA: Insufficient documentation

## 2015-03-06 DIAGNOSIS — R110 Nausea: Secondary | ICD-10-CM | POA: Insufficient documentation

## 2015-03-06 DIAGNOSIS — F17210 Nicotine dependence, cigarettes, uncomplicated: Secondary | ICD-10-CM | POA: Insufficient documentation

## 2015-03-06 DIAGNOSIS — R300 Dysuria: Secondary | ICD-10-CM | POA: Insufficient documentation

## 2015-03-06 DIAGNOSIS — Z79899 Other long term (current) drug therapy: Secondary | ICD-10-CM | POA: Insufficient documentation

## 2015-03-06 DIAGNOSIS — R0602 Shortness of breath: Secondary | ICD-10-CM | POA: Insufficient documentation

## 2015-03-06 DIAGNOSIS — Z9851 Tubal ligation status: Secondary | ICD-10-CM | POA: Insufficient documentation

## 2015-03-06 DIAGNOSIS — B9689 Other specified bacterial agents as the cause of diseases classified elsewhere: Secondary | ICD-10-CM

## 2015-03-06 DIAGNOSIS — Z3202 Encounter for pregnancy test, result negative: Secondary | ICD-10-CM | POA: Insufficient documentation

## 2015-03-06 DIAGNOSIS — Z87442 Personal history of urinary calculi: Secondary | ICD-10-CM | POA: Insufficient documentation

## 2015-03-06 DIAGNOSIS — R079 Chest pain, unspecified: Secondary | ICD-10-CM | POA: Insufficient documentation

## 2015-03-06 DIAGNOSIS — R42 Dizziness and giddiness: Secondary | ICD-10-CM | POA: Insufficient documentation

## 2015-03-06 DIAGNOSIS — N939 Abnormal uterine and vaginal bleeding, unspecified: Secondary | ICD-10-CM

## 2015-03-06 DIAGNOSIS — R197 Diarrhea, unspecified: Secondary | ICD-10-CM | POA: Insufficient documentation

## 2015-03-06 DIAGNOSIS — Z86018 Personal history of other benign neoplasm: Secondary | ICD-10-CM | POA: Insufficient documentation

## 2015-03-06 DIAGNOSIS — Z90710 Acquired absence of both cervix and uterus: Secondary | ICD-10-CM | POA: Insufficient documentation

## 2015-03-06 LAB — CBC WITH DIFFERENTIAL/PLATELET
Basophils Absolute: 0 10*3/uL (ref 0.0–0.1)
Basophils Relative: 0 %
EOS PCT: 1 %
Eosinophils Absolute: 0.1 10*3/uL (ref 0.0–0.7)
HCT: 41.1 % (ref 36.0–46.0)
Hemoglobin: 13.9 g/dL (ref 12.0–15.0)
LYMPHS ABS: 2.1 10*3/uL (ref 0.7–4.0)
LYMPHS PCT: 23 %
MCH: 29.8 pg (ref 26.0–34.0)
MCHC: 33.8 g/dL (ref 30.0–36.0)
MCV: 88.2 fL (ref 78.0–100.0)
MONOS PCT: 7 %
Monocytes Absolute: 0.6 10*3/uL (ref 0.1–1.0)
Neutro Abs: 6.5 10*3/uL (ref 1.7–7.7)
Neutrophils Relative %: 69 %
PLATELETS: 230 10*3/uL (ref 150–400)
RBC: 4.66 MIL/uL (ref 3.87–5.11)
RDW: 13.1 % (ref 11.5–15.5)
WBC: 9.3 10*3/uL (ref 4.0–10.5)

## 2015-03-06 LAB — URINALYSIS, ROUTINE W REFLEX MICROSCOPIC
BILIRUBIN URINE: NEGATIVE
Glucose, UA: NEGATIVE mg/dL
Ketones, ur: NEGATIVE mg/dL
Leukocytes, UA: NEGATIVE
NITRITE: NEGATIVE
PH: 6 (ref 5.0–8.0)
Protein, ur: NEGATIVE mg/dL
SPECIFIC GRAVITY, URINE: 1.024 (ref 1.005–1.030)

## 2015-03-06 LAB — WET PREP, GENITAL
Sperm: NONE SEEN
Trich, Wet Prep: NONE SEEN
Yeast Wet Prep HPF POC: NONE SEEN

## 2015-03-06 LAB — BASIC METABOLIC PANEL
ANION GAP: 6 (ref 5–15)
BUN: 12 mg/dL (ref 6–20)
CHLORIDE: 108 mmol/L (ref 101–111)
CO2: 23 mmol/L (ref 22–32)
Calcium: 8.7 mg/dL — ABNORMAL LOW (ref 8.9–10.3)
Creatinine, Ser: 0.73 mg/dL (ref 0.44–1.00)
GFR calc non Af Amer: >60 mL/min (ref >60)
Glucose, Bld: 91 mg/dL (ref 65–99)
Potassium: 3.9 mmol/L (ref 3.5–5.1)
Sodium: 137 mmol/L (ref 135–145)

## 2015-03-06 LAB — URINE MICROSCOPIC-ADD ON

## 2015-03-06 LAB — PREGNANCY, URINE: Preg Test, Ur: NEGATIVE

## 2015-03-06 MED ORDER — SODIUM CHLORIDE 0.9 % IV BOLUS (SEPSIS)
1000.0000 mL | Freq: Once | INTRAVENOUS | Status: AC
  Administered 2015-03-06: 1000 mL via INTRAVENOUS

## 2015-03-06 MED ORDER — KETOROLAC TROMETHAMINE 30 MG/ML IJ SOLN
30.0000 mg | Freq: Once | INTRAMUSCULAR | Status: AC
  Administered 2015-03-06: 30 mg via INTRAVENOUS
  Filled 2015-03-06: qty 1

## 2015-03-06 MED ORDER — METRONIDAZOLE 500 MG PO TABS
500.0000 mg | ORAL_TABLET | Freq: Once | ORAL | Status: AC
  Administered 2015-03-06: 500 mg via ORAL
  Filled 2015-03-06: qty 1

## 2015-03-06 MED ORDER — METRONIDAZOLE 500 MG PO TABS: 500.0000 mg | ORAL_TABLET | Freq: Two times a day (BID) | ORAL | Status: DC

## 2015-03-06 NOTE — ED Notes (Addendum)
Pt reports 5 weeks of excessive vaginal bleeding, with large blood clots, with periods of dizziness. Pt states "I need a hysterectomy." Pt reports being seen by [facility] for fibroids, placed on Birth Control 8 weeks ago and has had bleeding since. Pt reports lack of access to care for follow up OB GYN appointment due to insurance problems. Pt reports feeling very frustrated.

## 2015-03-06 NOTE — ED Notes (Signed)
Kayla PA notified of patient BP. No new orders, states okay to discharge patient home.

## 2015-03-06 NOTE — Discharge Instructions (Signed)
Dysfunctional Uterine Bleeding Dysfunctional uterine bleeding is abnormal bleeding from the uterus. Dysfunctional uterine bleeding includes:  A period that comes earlier or later than usual.  A period that is lighter, heavier, or has blood clots.  Bleeding between periods.  Skipping one or more periods.  Bleeding after sexual intercourse.  Bleeding after menopause. HOME CARE INSTRUCTIONS  Pay attention to any changes in your symptoms. Follow these instructions to help with your condition: Eating  Eat well-balanced meals. Include foods that are high in iron, such as liver, meat, shellfish, green leafy vegetables, and eggs.  If you become constipated:  Drink plenty of water.  Eat fruits and vegetables that are high in water and fiber, such as spinach, carrots, raspberries, apples, and mango. Medicines  Take over-the-counter and prescription medicines only as told by your health care provider.  Do not change medicines without talking with your health care provider.  Aspirin or medicines that contain aspirin may make the bleeding worse. Do not take those medicines:  During the week before your period.  During your period.  If you were prescribed iron pills, take them as told by your health care provider. Iron pills help to replace iron that your body loses because of this condition. Activity  If you need to change your sanitary pad or tampon more than one time every 2 hours:  Lie in bed with your feet raised (elevated).  Place a cold pack on your lower abdomen.  Rest as much as possible until the bleeding stops or slows down.  Do not try to lose weight until the bleeding has stopped and your blood iron level is back to normal. Other Instructions  For two months, write down:  When your period starts.  When your period ends.  When any abnormal bleeding occurs.  What problems you notice.  Keep all follow up visits as told by your health care provider. This is  important. SEEK MEDICAL CARE IF:  You get light-headed or weak.  You have nausea and vomiting.  You cannot eat or drink without vomiting.  You feel dizzy or have diarrhea while you are taking medicines.  You are taking birth control pills or hormones, and you want to change them or stop taking them. SEEK IMMEDIATE MEDICAL CARE IF:  You develop a fever or chills.  You need to change your sanitary pad or tampon more than one time per hour.  Your bleeding becomes heavier, or your flow contains clots more often.  You develop pain in your abdomen.  You lose consciousness.  You develop a rash.   This information is not intended to replace advice given to you by your health care provider. Make sure you discuss any questions you have with your health care provider.   Document Released: 02/22/2000 Document Revised: 11/15/2014 Document Reviewed: 05/22/2014 Elsevier Interactive Patient Education 2016 Elsevier Inc. Bacterial Vaginosis   Bacterial vaginosis is a vaginal infection that occurs when the normal balance of bacteria in the vagina is disrupted. It results from an overgrowth of certain bacteria. This is the most common vaginal infection in women of childbearing age. Treatment is important to prevent complications, especially in pregnant women, as it can cause a premature delivery.  CAUSES  Bacterial vaginosis is caused by an increase in harmful bacteria that are normally present in smaller amounts in the vagina. Several different kinds of bacteria can cause bacterial vaginosis. However, the reason that the condition develops is not fully understood.  RISK FACTORS  Certain activities or  behaviors can put you at an increased risk of developing bacterial vaginosis, including:  Having a new sex partner or multiple sex partners.  Douching.  Using an intrauterine device (IUD) for contraception. Women do not get bacterial vaginosis from toilet seats, bedding, swimming pools, or contact  with objects around them.  SIGNS AND SYMPTOMS  Some women with bacterial vaginosis have no signs or symptoms. Common symptoms include:  Grey vaginal discharge.  A fishlike odor with discharge, especially after sexual intercourse.  Itching or burning of the vagina and vulva.  Burning or pain with urination. DIAGNOSIS  Your health care provider will take a medical history and examine the vagina for signs of bacterial vaginosis. A sample of vaginal fluid may be taken. Your health care provider will look at this sample under a microscope to check for bacteria and abnormal cells. A vaginal pH test may also be done.  TREATMENT  Bacterial vaginosis may be treated with antibiotic medicines. These may be given in the form of a pill or a vaginal cream. A second round of antibiotics may be prescribed if the condition comes back after treatment. Because bacterial vaginosis increases your risk for sexually transmitted diseases, getting treated can help reduce your risk for chlamydia, gonorrhea, HIV, and herpes.  HOME CARE INSTRUCTIONS  Only take over-the-counter or prescription medicines as directed by your health care provider.  If antibiotic medicine was prescribed, take it as directed. Make sure you finish it even if you start to feel better.  Tell all sexual partners that you have a vaginal infection. They should see their health care provider and be treated if they have problems, such as a mild rash or itching.  During treatment, it is important that you follow these instructions:  Avoid sexual activity or use condoms correctly.  Do not douche.  Avoid alcohol as directed by your health care provider.  Avoid breastfeeding as directed by your health care provider. SEEK MEDICAL CARE IF:  Your symptoms are not improving after 3 days of treatment.  You have increased discharge or pain.  You have a fever. MAKE SURE YOU:  Understand these instructions.  Will watch your condition.  Will get help right  away if you are not doing well or get worse. FOR MORE INFORMATION  Centers for Disease Control and Prevention, Division of STD Prevention: AppraiserFraud.fi  American Sexual Health Association (ASHA): www.ashastd.org  This information is not intended to replace advice given to you by your health care provider. Make sure you discuss any questions you have with your health care provider.  Document Released: 02/24/2005 Document Revised: 03/17/2014 Document Reviewed: 10/06/2012  Elsevier Interactive Patient Education [employer].

## 2015-03-06 NOTE — ED Provider Notes (Signed)
CSN: [id number]     Arrival date & time 03/06/15  1056 History   First MD Initiated Contact with Patient 03/06/15 1231     Chief Complaint  Patient presents with  . Vaginal Bleeding     (Consider location/radiation/quality/duration/timing/severity/associated sxs/prior Treatment) Patient is a 34 y.o. female presenting with vaginal bleeding. The history is provided by the patient.  Vaginal Bleeding Quality:  Heavier than menses and clots Severity:  Moderate Onset quality:  Gradual Duration:  5 weeks Timing:  Constant Progression:  Worsening Chronicity:  New Menstrual history:  Regular Number of tampons used:  Over 10 regular tampons / day Context: at rest and spontaneously   Context: not foreign body and not genital trauma   Relieved by:  Nothing Worsened by:  Nothing tried Ineffective treatments:  None tried Associated symptoms: dizziness, dyspareunia, dysuria and nausea   Associated symptoms: no abdominal pain, no back pain, no fever and no vaginal discharge   Risk factors: no bleeding disorder, no gynecological surgery and does not have multiple partners   Risk factors comment:  Hx of fibroids and enlarged uterus  Johnnye P Jimmye Norman is a 34 y.o. female with PMH significant for fibroids, nephrolithiasis, and seizures who presents with 5 week history of gradual onset, severe, worsening vaginal bleeding.  Patient reports she had been on OCP for about 4 months, and about 5 weeks ago she was switched to another OCP when she began experiencing this ongoing vaginal bleeding.  She reports she has been seen in the past by [facility] who original started her on OCPs for fibroids as an alternative to hysterectomy.  Patient reports that since the change she has been experiencing heavy bleeding and recently began passing clots a couple of days ago.  Normal periods for her are "light" (~5 tampons/day) and last about 5-7 days.  No genital trauma.  She has one sexual partner.    Past Medical  History  Diagnosis Date  . Fibroids   . Kidney stone   . Seizures [facility])    Past Surgical History  Procedure Laterality Date  . Kidney stone surgery    . Tubal ligation     History reviewed. No pertinent family history. Social History  Substance Use Topics  . Smoking status: Current Every Day Smoker -- 0.50 packs/day    Types: Cigarettes  . Smokeless tobacco: None  . Alcohol Use: No   OB History    No data available     Review of Systems  Constitutional: Negative for fever.  Respiratory: Positive for shortness of breath.   Cardiovascular: Positive for chest pain.  Gastrointestinal: Positive for nausea and diarrhea. Negative for abdominal pain.  Genitourinary: Positive for dysuria, vaginal bleeding, pelvic pain and dyspareunia. Negative for vaginal discharge and menstrual problem.  Musculoskeletal: Negative for back pain.  Neurological: Positive for dizziness and light-headedness. Negative for syncope.      Allergies  Codeine  Home Medications   Prior to Admission medications   Medication Sig Start Date End Date Taking? Authorizing Provider  divalproex (DEPAKOTE) 500 MG DR tablet Take 500 mg by mouth 2 (two) times daily.   Yes Historical Provider, MD  ibuprofen (ADVIL,MOTRIN) 600 MG tablet Take 1 tablet (600 mg total) by mouth every 6 (six) hours as needed. 01/14/15  Yes Julianne Rice, MD  naproxen (NAPROSYN) 500 MG tablet Take 1 tablet (500 mg total) by mouth 2 (two) times daily. 11/15/14  Yes Fredia Sorrow, MD  NON FORMULARY Janel birth control   Yes  Historical Provider, MD  promethazine (PHENERGAN) 25 MG tablet Take 1 tablet (25 mg total) by mouth every 6 (six) hours as needed for nausea or vomiting. 11/15/14  Yes Fredia Sorrow, MD  HYDROcodone-acetaminophen (NORCO) 5-325 MG tablet Take 1-2 tablets by mouth every 4 (four) hours as needed for severe pain. 01/14/15   Julianne Rice, MD  methocarbamol (ROBAXIN) 500 MG tablet Take 1 tablet (500 mg total) by mouth every 8  (eight) hours as needed for muscle spasms. 01/14/15   Julianne Rice, MD  metroNIDAZOLE (FLAGYL) 500 MG tablet Take 1 tablet (500 mg total) by mouth 2 (two) times daily. 03/06/15   Nataly Pacifico, PA-C   BP 94/69 mmHg  Pulse 55  Temp(Src) 98.3 F (36.8 C) (Oral)  Resp 18  Ht 5\' 5"  (1.651 m)  Wt 61.236 kg  BMI 22.47 kg/m2  SpO2 100%  LMP 03/06/2015 Physical Exam  Constitutional: She is oriented to person, place, and time. She appears well-developed and well-nourished.  HENT:  Head: Normocephalic and atraumatic.  Mouth/Throat: Oropharynx is clear and moist.  Eyes: Conjunctivae are normal. Pupils are equal, round, and reactive to light.  Neck: Normal range of motion. Neck supple.  Cardiovascular: Normal rate, regular rhythm and normal heart sounds.   No murmur heard. Pulmonary/Chest: Effort normal and breath sounds normal. No accessory muscle usage or stridor. No respiratory distress. She has no wheezes. She has no rhonchi. She has no rales.  Abdominal: Soft. Bowel sounds are normal. She exhibits no distension. There is no tenderness.  Genitourinary: Uterus is enlarged. Cervix exhibits no motion tenderness and no discharge. Right adnexum displays tenderness. Left adnexum displays tenderness. There is bleeding in the vagina. No tenderness in the vagina. No foreign body around the vagina. No signs of injury around the vagina. No vaginal discharge found.  Musculoskeletal: Normal range of motion.  Lymphadenopathy:    She has no cervical adenopathy.  Neurological: She is alert and oriented to person, place, and time.  Speech clear without dysarthria.  Skin: Skin is warm and dry.  Psychiatric: She has a normal mood and affect. Her behavior is normal.    ED Course  Procedures (including critical care time) Labs Review Labs Reviewed  WET PREP, GENITAL - Abnormal; Notable for the following:    Clue Cells Wet Prep HPF POC PRESENT (*)    WBC, Wet Prep HPF POC MODERATE (*)    All other  components within normal limits  URINALYSIS, ROUTINE W REFLEX MICROSCOPIC (NOT AT [facility]) - Abnormal; Notable for the following:    Hgb urine dipstick TRACE (*)    All other components within normal limits  BASIC METABOLIC PANEL - Abnormal; Notable for the following:    Calcium 8.7 (*)    All other components within normal limits  URINE MICROSCOPIC-ADD ON - Abnormal; Notable for the following:    Squamous Epithelial / LPF 0-5 (*)    Bacteria, UA FEW (*)    All other components within normal limits  CBC WITH DIFFERENTIAL/PLATELET  PREGNANCY, URINE  HIV ANTIBODY (ROUTINE TESTING)  RPR  GC/CHLAMYDIA PROBE AMP ([place]) NOT AT [facility]    Imaging Review No results found. I have personally reviewed and evaluated these images and lab results as part of my medical decision-making.   EKG Interpretation None      MDM   Final diagnoses:  Vaginal bleeding  Bacterial vaginosis    Patient with hx of fibroids and enlarged uterus presents with 5 week history of vaginal bleeding.  Denies syncope, but does endorse occasionally feeling dizzy, lightheaded, short of breath, and chest pains.  VSS, NAD.  On exam, heart RRR, lungs CTAB, abdomen soft and benign.  GU, no CMT or discharge.  There is mild bleeding.  Will start fluids and manage pain.  Will obtain CBC, BMP, urine pregnancy, HIV/RPR, GC/chlamydia. Urine pregnancy negative, UA negative.  Wet prep shows clue cells.  Will d/c home with flagyl. CBC unremarkable with hgb 13.9 and WBC 9.3 BMP unremarkable Doubt TOA, doubt torsion, doubt PID, doubt STD.  High suspicion for fibroids.  Anticipate d/c with gynecology follow up.  Evaluation does not show pathology requiring ongoing emergent intervention or admission. Pt is hemodynamically stable and mentating appropriately. Discussed findings/results and plan with patient/guardian, who agrees with plan. All questions answered. Return precautions discussed and outpatient follow up given.     Gloriann Loan, PA-C 03/06/15 1524  Virgel Manifold, MD 03/06/15 [phone]

## 2015-03-06 NOTE — ED Notes (Signed)
Assumed care of patient from [place], [place]. Pt resting quietly at this time. No distress. VSS. Significant other at bedside. States pain meds that were previously given were effective.

## 2015-03-07 LAB — HIV ANTIBODY (ROUTINE TESTING W REFLEX): HIV Screen 4th Generation wRfx: NONREACTIVE

## 2015-03-07 LAB — RPR: RPR Ser Ql: NONREACTIVE

## 2015-03-10 LAB — GC/CHLAMYDIA PROBE AMP (~~LOC~~) NOT AT ARMC
Chlamydia: NEGATIVE
NEISSERIA GONORRHEA: NEGATIVE

## 2015-03-28 ENCOUNTER — Encounter: Payer: Medicaid Other | Admitting: Obstetrics & Gynecology

## 2015-04-29 ENCOUNTER — Emergency Department (HOSPITAL_BASED_OUTPATIENT_CLINIC_OR_DEPARTMENT_OTHER)
Admission: EM | Admit: 2015-04-29 | Discharge: 2015-04-29 | Disposition: A | Discharge status: Home / Self Care | Payer: Medicaid Other | Attending: Emergency Medicine | Admitting: Emergency Medicine

## 2015-04-29 ENCOUNTER — Encounter (HOSPITAL_BASED_OUTPATIENT_CLINIC_OR_DEPARTMENT_OTHER): Payer: Self-pay | Admitting: *Deleted

## 2015-04-29 DIAGNOSIS — Z87448 Personal history of other diseases of urinary system: Secondary | ICD-10-CM | POA: Insufficient documentation

## 2015-04-29 DIAGNOSIS — Z79899 Other long term (current) drug therapy: Secondary | ICD-10-CM | POA: Insufficient documentation

## 2015-04-29 DIAGNOSIS — B3731 Acute candidiasis of vulva and vagina: Secondary | ICD-10-CM

## 2015-04-29 DIAGNOSIS — Z791 Long term (current) use of non-steroidal anti-inflammatories (NSAID): Secondary | ICD-10-CM | POA: Insufficient documentation

## 2015-04-29 DIAGNOSIS — B373 Candidiasis of vulva and vagina: Secondary | ICD-10-CM | POA: Insufficient documentation

## 2015-04-29 DIAGNOSIS — F17210 Nicotine dependence, cigarettes, uncomplicated: Secondary | ICD-10-CM | POA: Insufficient documentation

## 2015-04-29 DIAGNOSIS — Z792 Long term (current) use of antibiotics: Secondary | ICD-10-CM | POA: Insufficient documentation

## 2015-04-29 DIAGNOSIS — Z3202 Encounter for pregnancy test, result negative: Secondary | ICD-10-CM | POA: Insufficient documentation

## 2015-04-29 DIAGNOSIS — Z86018 Personal history of other benign neoplasm: Secondary | ICD-10-CM | POA: Insufficient documentation

## 2015-04-29 LAB — URINALYSIS, ROUTINE W REFLEX MICROSCOPIC
Bilirubin Urine: NEGATIVE
GLUCOSE, UA: NEGATIVE mg/dL
HGB URINE DIPSTICK: NEGATIVE
KETONES UR: NEGATIVE mg/dL
Nitrite: NEGATIVE
PROTEIN: NEGATIVE mg/dL
Specific Gravity, Urine: 1.026 (ref 1.005–1.030)
pH: 6 (ref 5.0–8.0)

## 2015-04-29 LAB — URINE MICROSCOPIC-ADD ON

## 2015-04-29 LAB — WET PREP, GENITAL
SPERM: NONE SEEN
TRICH WET PREP: NONE SEEN

## 2015-04-29 LAB — PREGNANCY, URINE: Preg Test, Ur: NEGATIVE

## 2015-04-29 MED ORDER — FLUCONAZOLE 150 MG PO TABS: 150.0000 mg | ORAL_TABLET | Freq: Once | ORAL | Status: DC

## 2015-04-29 MED ORDER — FLUCONAZOLE 50 MG PO TABS
150.0000 mg | ORAL_TABLET | Freq: Once | ORAL | Status: AC
  Administered 2015-04-29: 150 mg via ORAL
  Filled 2015-04-29: qty 1

## 2015-04-29 NOTE — ED Notes (Signed)
Patient c/o vaginal discharge & burning and itching.  She thought it was a yeast infection and used monistat but no relief

## 2015-04-29 NOTE — Discharge Instructions (Signed)

## 2015-04-29 NOTE — ED Provider Notes (Signed)
CSN: [id number]     Arrival date & time 04/29/15  [id number] History   First MD Initiated Contact with Patient 04/29/15 0740     Chief Complaint  Patient presents with  . Vaginal Discharge     (Consider location/radiation/quality/duration/timing/severity/associated sxs/prior Treatment) Patient is a 35 y.o. female presenting with female genitourinary complaint. The history is provided by the patient.  Female GU Problem This is a new problem. The current episode started 2 days ago. The problem occurs constantly. The problem has been gradually worsening. Associated symptoms comments: Vaginal itching, redness, white thick discharge. Nothing aggravates the symptoms. Nothing relieves the symptoms. She has tried nothing for the symptoms.    Past Medical History  Diagnosis Date  . Fibroids   . Kidney stone   . Seizures [facility])    Past Surgical History  Procedure Laterality Date  . Kidney stone surgery    . Tubal ligation     No family history on file. Social History  Substance Use Topics  . Smoking status: Current Every Day Smoker -- 0.50 packs/day    Types: Cigarettes  . Smokeless tobacco: None  . Alcohol Use: No   OB History    No data available     Review of Systems  All other systems reviewed and are negative.     Allergies  Codeine  Home Medications   Prior to Admission medications   Medication Sig Start Date End Date Taking? Authorizing Provider  divalproex (DEPAKOTE) 500 MG DR tablet Take 500 mg by mouth 2 (two) times daily.    Historical Provider, MD  HYDROcodone-acetaminophen (NORCO) 5-325 MG tablet Take 1-2 tablets by mouth every 4 (four) hours as needed for severe pain. 01/14/15   Julianne Rice, MD  ibuprofen (ADVIL,MOTRIN) 600 MG tablet Take 1 tablet (600 mg total) by mouth every 6 (six) hours as needed. 01/14/15   Julianne Rice, MD  methocarbamol (ROBAXIN) 500 MG tablet Take 1 tablet (500 mg total) by mouth every 8 (eight) hours as needed for muscle spasms.  01/14/15   Julianne Rice, MD  metroNIDAZOLE (FLAGYL) 500 MG tablet Take 1 tablet (500 mg total) by mouth 2 (two) times daily. 03/06/15   Gloriann Loan, PA-C  naproxen (NAPROSYN) 500 MG tablet Take 1 tablet (500 mg total) by mouth 2 (two) times daily. 11/15/14   Fredia Sorrow, MD  NON FORMULARY Janel birth control    Historical Provider, MD  promethazine (PHENERGAN) 25 MG tablet Take 1 tablet (25 mg total) by mouth every 6 (six) hours as needed for nausea or vomiting. 11/15/14   Fredia Sorrow, MD   BP 106/74 mmHg  Pulse 79  Temp(Src) 98.2 F (36.8 C) (Oral)  Resp 18  Ht 5\' 6"  (1.676 m)  Wt 130 lb (58.968 kg)  BMI 20.99 kg/m2  SpO2 100% Physical Exam  Constitutional: She is oriented to person, place, and time. She appears well-developed and well-nourished. No distress.  HENT:  Head: Normocephalic.  Eyes: Conjunctivae are normal.  Neck: Neck supple. No tracheal deviation present.  Cardiovascular: Normal rate and regular rhythm.   Pulmonary/Chest: Effort normal. No respiratory distress.  Abdominal: Soft. She exhibits no distension.  Genitourinary: There is erythema (With pruritus associated) in the vagina. No bleeding in the vagina. Vaginal discharge (thick, white) found.  Neurological: She is alert and oriented to person, place, and time.  Skin: Skin is warm and dry.  Psychiatric: She has a normal mood and affect.    ED Course  Procedures (including critical care time)  Labs Review Labs Reviewed  WET PREP, GENITAL - Abnormal; Notable for the following:    Yeast Wet Prep HPF POC PRESENT (*)    Clue Cells Wet Prep HPF POC PRESENT (*)    WBC, Wet Prep HPF POC MANY (*)    All other components within normal limits  URINALYSIS, ROUTINE W REFLEX MICROSCOPIC (NOT AT [facility]) - Abnormal; Notable for the following:    APPearance CLOUDY (*)    Leukocytes, UA TRACE (*)    All other components within normal limits  URINE MICROSCOPIC-ADD ON - Abnormal; Notable for the following:    Squamous  Epithelial / LPF 6-30 (*)    Bacteria, UA FEW (*)    All other components within normal limits  PREGNANCY, URINE    Imaging Review No results found. I have personally reviewed and evaluated these images and lab results as part of my medical decision-making.   EKG Interpretation None      MDM   Final diagnoses:  Yeast infection of the vagina    35 y.o. female presents with copious white vaginal discharge after being treated recently for bacterial vaginosis. She is having itching and examination is consistent with candidal infection. Given a dose of fluconazole here for empiric treatment and provided a prescription for a second dose if first dose percent effective. Recommended supportive care measures until medicine is able to improve symptoms. Recently had STI screening and does not have any new partners, will not repeat GC chlamydia today.    Leo Grosser, MD 04/29/15 [phone]

## 2015-04-29 NOTE — ED Notes (Signed)
Rx x 1 given for diflucan with instructions to take dose on Feb 22 per Dr Laneta Simmers

## 2015-10-10 ENCOUNTER — Encounter (HOSPITAL_BASED_OUTPATIENT_CLINIC_OR_DEPARTMENT_OTHER): Payer: Self-pay | Admitting: Emergency Medicine

## 2015-10-10 ENCOUNTER — Emergency Department (HOSPITAL_BASED_OUTPATIENT_CLINIC_OR_DEPARTMENT_OTHER)
Admission: EM | Admit: 2015-10-10 | Discharge: 2015-10-10 | Disposition: A | Discharge status: Home / Self Care | Payer: Medicaid Other | Attending: Emergency Medicine | Admitting: Emergency Medicine

## 2015-10-10 DIAGNOSIS — F17210 Nicotine dependence, cigarettes, uncomplicated: Secondary | ICD-10-CM | POA: Insufficient documentation

## 2015-10-10 DIAGNOSIS — B9689 Other specified bacterial agents as the cause of diseases classified elsewhere: Secondary | ICD-10-CM

## 2015-10-10 DIAGNOSIS — Z79899 Other long term (current) drug therapy: Secondary | ICD-10-CM | POA: Diagnosis not present

## 2015-10-10 DIAGNOSIS — N760 Acute vaginitis: Secondary | ICD-10-CM

## 2015-10-10 DIAGNOSIS — A5901 Trichomonal vulvovaginitis: Secondary | ICD-10-CM | POA: Diagnosis not present

## 2015-10-10 DIAGNOSIS — A599 Trichomoniasis, unspecified: Secondary | ICD-10-CM

## 2015-10-10 DIAGNOSIS — R102 Pelvic and perineal pain: Secondary | ICD-10-CM | POA: Diagnosis present

## 2015-10-10 LAB — WET PREP, GENITAL
Sperm: NONE SEEN
Yeast Wet Prep HPF POC: NONE SEEN

## 2015-10-10 LAB — URINALYSIS, ROUTINE W REFLEX MICROSCOPIC
Bilirubin Urine: NEGATIVE
Glucose, UA: NEGATIVE mg/dL
Ketones, ur: NEGATIVE mg/dL
Leukocytes, UA: NEGATIVE
Nitrite: NEGATIVE
Protein, ur: NEGATIVE mg/dL
Specific Gravity, Urine: 1.02 (ref 1.005–1.030)
pH: 5.5 (ref 5.0–8.0)

## 2015-10-10 LAB — BASIC METABOLIC PANEL
ANION GAP: 6 (ref 5–15)
BUN: 10 mg/dL (ref 6–20)
CHLORIDE: 106 mmol/L (ref 101–111)
CO2: 28 mmol/L (ref 22–32)
Calcium: 8.9 mg/dL (ref 8.9–10.3)
Creatinine, Ser: 0.81 mg/dL (ref 0.44–1.00)
Glucose, Bld: 54 mg/dL — ABNORMAL LOW (ref 65–99)
POTASSIUM: 3.4 mmol/L — AB (ref 3.5–5.1)
SODIUM: 140 mmol/L (ref 135–145)

## 2015-10-10 LAB — URINE MICROSCOPIC-ADD ON

## 2015-10-10 LAB — CBC
HEMATOCRIT: 38.4 % (ref 36.0–46.0)
HEMOGLOBIN: 13.1 g/dL (ref 12.0–15.0)
MCH: 30.3 pg (ref 26.0–34.0)
MCHC: 34.1 g/dL (ref 30.0–36.0)
MCV: 88.9 fL (ref 78.0–100.0)
Platelets: 186 10*3/uL (ref 150–400)
RBC: 4.32 MIL/uL (ref 3.87–5.11)
RDW: 14.6 % (ref 11.5–15.5)
WBC: 7.2 10*3/uL (ref 4.0–10.5)

## 2015-10-10 LAB — PREGNANCY, URINE: Preg Test, Ur: NEGATIVE

## 2015-10-10 MED ORDER — METRONIDAZOLE 500 MG PO TABS: 500.0000 mg | ORAL_TABLET | Freq: Two times a day (BID) | ORAL | 0 refills | Status: DC

## 2015-10-10 NOTE — ED Notes (Signed)
Pt report fever yesterday of 101.3. Pt has not taken any antipyretics today.

## 2015-10-10 NOTE — ED Triage Notes (Signed)
Patient states that she is having burning to her pelvic region and a vaginal discharge at all times. The patient also reports frequent urination and burning with urination

## 2015-10-10 NOTE — ED Provider Notes (Signed)
[place] DEPT [facility] Provider Note   CSN: [id number] Arrival date & time: 10/10/15  1735  First Provider Contact:  First MD Initiated Contact with Patient 10/10/15 1810     By signing my name below, I, Evelene Croon, attest that this documentation has been prepared under the direction and in the presence of Ripley Fraise, MD . Electronically Signed: Evelene Croon, Scribe. 10/10/2015. 6:35 PM  History   Chief Complaint Chief Complaint  Patient presents with  . Pelvic Pain   The history is provided by the patient. No language interpreter was used.  Pelvic Pain  This is a new problem. The current episode started yesterday. The problem occurs constantly. The problem has not changed since onset.Pertinent negatives include no chest pain, no headaches and no shortness of breath. Nothing relieves the symptoms.    HPI Comments:  Tracey French is a 35 y.o. female who presents to the Emergency Department complaining of constant pelvic pain and pressure since yesterday. She reports associated urinary urgency, fever (TMAX 101 last night), nausea and malodorous vaginal discharge. She denies diarrhea, vomiting, vaginal bleeding, CP, and cough. Pt has a h/o STD which she was treated for with antibiotic; at this time she states she has no concern for STDs. She also has a h/o abdominal surgery--hernia repair and tubal ligation. No alleviating factors noted.   Past Medical History:  Diagnosis Date  . Fibroids   . Kidney stone   . Seizures ([place])     There are no active problems to display for this patient.   Past Surgical History:  Procedure Laterality Date  . KIDNEY STONE SURGERY    . TUBAL LIGATION      OB History    No data available       Home Medications    Prior to Admission medications   Medication Sig Start Date End Date Taking? Authorizing Provider  divalproex (DEPAKOTE) 500 MG DR tablet Take 500 mg by mouth 2 (two) times daily.    Historical Provider, MD  fluconazole  (DIFLUCAN) 150 MG tablet Take 1 tablet (150 mg total) by mouth once. If symptoms persist 05/02/15   Leo Grosser, MD  HYDROcodone-acetaminophen [facility]) 5-325 MG tablet Take 1-2 tablets by mouth every 4 (four) hours as needed for severe pain. 01/14/15   Julianne Rice, MD  ibuprofen (ADVIL,MOTRIN) 600 MG tablet Take 1 tablet (600 mg total) by mouth every 6 (six) hours as needed. 01/14/15   Julianne Rice, MD  methocarbamol (ROBAXIN) 500 MG tablet Take 1 tablet (500 mg total) by mouth every 8 (eight) hours as needed for muscle spasms. 01/14/15   Julianne Rice, MD  metroNIDAZOLE (FLAGYL) 500 MG tablet Take 1 tablet (500 mg total) by mouth 2 (two) times daily. 03/06/15   Gloriann Loan, PA-C  naproxen (NAPROSYN) 500 MG tablet Take 1 tablet (500 mg total) by mouth 2 (two) times daily. 11/15/14   Fredia Sorrow, MD  NON FORMULARY Janel birth control    Historical Provider, MD  promethazine (PHENERGAN) 25 MG tablet Take 1 tablet (25 mg total) by mouth every 6 (six) hours as needed for nausea or vomiting. 11/15/14   Fredia Sorrow, MD    Family History History reviewed. No pertinent family history.  Social History Social History  Substance Use Topics  . Smoking status: Current Every Day Smoker    Packs/day: 0.50    Types: Cigarettes  . Smokeless tobacco: Never Used  . Alcohol use No     Allergies   Codeine  Review of Systems Review of Systems  Respiratory: Negative for shortness of breath.   Cardiovascular: Negative for chest pain.  Gastrointestinal: Positive for nausea. Negative for diarrhea and vomiting.  Genitourinary: Positive for pelvic pain, urgency and vaginal discharge. Negative for vaginal bleeding.  Neurological: Negative for headaches.  All other systems reviewed and are negative.    Physical Exam Updated Vital Signs BP 102/70 (BP Location: Left Arm)   Pulse (!) 57   Temp 98 F (36.7 C) (Oral)   Resp 18   Ht 5\' 5"  (1.651 m)   Wt 134 lb (60.8 kg)   LMP 09/27/2015  (Approximate)   SpO2 99%   BMI 22.30 kg/m   Physical Exam CONSTITUTIONAL: Well developed/well nourished HEAD: Normocephalic/atraumatic ENMT: Mucous membranes moist NECK: supple no meningeal signs SPINE/BACK:entire spine nontender CV: S1/S2 noted, no murmurs/rubs/gallops noted LUNGS: Lungs are clear to auscultation bilaterally, no apparent distress ABDOMEN: soft, nontender, no rebound or guarding, bowel sounds noted throughout abdomen GU:no cva tenderness, small amt of discharge, no vag bleeding, no cmt or adnexal tenderness/mass, Chaperone was present for exam which was performed with no discomfort or complications.  NEURO: Pt is awake/alert/appropriate, moves all extremitiesx4.  No facial droop.   EXTREMITIES: pulses normal/equal, full ROM SKIN: warm, color normal PSYCH: no abnormalities of mood noted, alert and oriented to situation   ED Treatments / Results  DIAGNOSTIC STUDIES:  Oxygen Saturation is 99% on RA, normal by my interpretation.    COORDINATION OF CARE:  6:22 PM Discussed treatment plan with pt at bedside and pt agreed to plan.  Labs (all labs ordered are listed, but only abnormal results are displayed) Labs Reviewed  WET PREP, GENITAL - Abnormal; Notable for the following:       Result Value   Trich, Wet Prep PRESENT (*)    Clue Cells Wet Prep HPF POC PRESENT (*)    WBC, Wet Prep HPF POC MODERATE (*)    All other components within normal limits  URINALYSIS, ROUTINE W REFLEX MICROSCOPIC (NOT AT [facility]) - Abnormal; Notable for the following:    APPearance CLOUDY (*)    Hgb urine dipstick SMALL (*)    All other components within normal limits  BASIC METABOLIC PANEL - Abnormal; Notable for the following:    Potassium 3.4 (*)    Glucose, Bld 54 (*)    All other components within normal limits  URINE MICROSCOPIC-ADD ON - Abnormal; Notable for the following:    Squamous Epithelial / LPF 6-30 (*)    Bacteria, UA FEW (*)    Casts HYALINE CASTS (*)    All other  components within normal limits  URINE CULTURE  CBC  PREGNANCY, URINE  GC/CHLAMYDIA PROBE AMP ([place]) NOT AT [facility]    EKG  EKG Interpretation None       Radiology No results found.  Procedures Procedures   Medications Ordered in ED Medications - No data to display   Initial Impression / Assessment and Plan / ED Course  I have reviewed the triage vital signs and the nursing notes.  Pertinent labs & imaging results that were available during my care of the patient were reviewed by me and considered in my medical decision making (see chart for details).  Clinical Course    Pt well appearing No focal abd/pelvis tenderness Will treat for BV and trichomonas Discussed need to avoid sexual contact until all tests are negative and treatment complete    Final Clinical Impressions(s) / ED Diagnoses  Final diagnoses:  None    New Prescriptions New Prescriptions   No medications on file  I personally performed the services described in this documentation, which was scribed in my presence. The recorded information has been reviewed and is accurate.        Ripley Fraise, MD 10/10/15 2038

## 2015-10-11 LAB — URINE CULTURE

## 2015-10-11 LAB — GC/CHLAMYDIA PROBE AMP (~~LOC~~) NOT AT ARMC
CHLAMYDIA, DNA PROBE: NEGATIVE
Neisseria Gonorrhea: NEGATIVE

## 2015-12-21 ENCOUNTER — Emergency Department (HOSPITAL_BASED_OUTPATIENT_CLINIC_OR_DEPARTMENT_OTHER)
Admission: EM | Admit: 2015-12-21 | Discharge: 2015-12-21 | Disposition: A | Discharge status: Home / Self Care | Payer: Medicaid Other | Attending: Emergency Medicine | Admitting: Emergency Medicine

## 2015-12-21 ENCOUNTER — Encounter (HOSPITAL_BASED_OUTPATIENT_CLINIC_OR_DEPARTMENT_OTHER): Payer: Self-pay | Admitting: *Deleted

## 2015-12-21 ENCOUNTER — Emergency Department (HOSPITAL_BASED_OUTPATIENT_CLINIC_OR_DEPARTMENT_OTHER): Payer: Medicaid Other

## 2015-12-21 DIAGNOSIS — F17210 Nicotine dependence, cigarettes, uncomplicated: Secondary | ICD-10-CM | POA: Insufficient documentation

## 2015-12-21 DIAGNOSIS — B349 Viral infection, unspecified: Secondary | ICD-10-CM | POA: Diagnosis not present

## 2015-12-21 DIAGNOSIS — Z791 Long term (current) use of non-steroidal anti-inflammatories (NSAID): Secondary | ICD-10-CM | POA: Insufficient documentation

## 2015-12-21 DIAGNOSIS — R509 Fever, unspecified: Secondary | ICD-10-CM | POA: Diagnosis present

## 2015-12-21 MED ORDER — ONDANSETRON 4 MG PO TBDP
4.0000 mg | ORAL_TABLET | Freq: Once | ORAL | Status: AC
  Administered 2015-12-21: 4 mg via ORAL
  Filled 2015-12-21: qty 1

## 2015-12-21 MED ORDER — BENZONATATE 100 MG PO CAPS
100.0000 mg | ORAL_CAPSULE | Freq: Three times a day (TID) | ORAL | 0 refills | Status: DC
Start: 2015-12-21 — End: 2016-04-01

## 2015-12-21 MED ORDER — ONDANSETRON 4 MG PO TBDP: 4.0000 mg | ORAL_TABLET | Freq: Three times a day (TID) | ORAL | 0 refills | Status: DC | PRN

## 2015-12-21 NOTE — ED Provider Notes (Signed)
[place] DEPT [facility] Provider Note   CSN: [id number] Arrival date & time: 12/21/15  1317     History   Chief Complaint Chief Complaint  Patient presents with  . Influenza    HPI Tracey French is a 35 y.o. female.  She was initially evaluation for a two-week illness. Started with sore throat. The sore ear. Had an abscessed tooth. Saw her dentist this morning and had her lower left third molar extracted. She states she was told by her dentist that her throat pain and pain are likely from her tooth. Her sat a cough she had 101 fever earlier today and presents here. Nausea yesterday and episode of nausea and sporting Prelone onset of vomiting yesterday none today. Has diffuse bodyaches. Did not receive a flu vaccine.  HPI  Past Medical History:  Diagnosis Date  . Fibroids   . Kidney stone   . Seizures ([place])     There are no active problems to display for this patient.   Past Surgical History:  Procedure Laterality Date  . KIDNEY STONE SURGERY    . TUBAL LIGATION      OB History    No data available       Home Medications    Prior to Admission medications   Medication Sig Start Date End Date Taking? Authorizing Provider  naproxen (NAPROSYN) 500 MG tablet Take 1 tablet (500 mg total) by mouth 2 (two) times daily. 11/15/14  Yes Fredia Sorrow, MD  benzonatate (TESSALON) 100 MG capsule Take 1 capsule (100 mg total) by mouth every 8 (eight) hours. 12/21/15   Tanna Furry, MD  ibuprofen (ADVIL,MOTRIN) 600 MG tablet Take 1 tablet (600 mg total) by mouth every 6 (six) hours as needed. 01/14/15   Julianne Rice, MD  metroNIDAZOLE (FLAGYL) 500 MG tablet Take 1 tablet (500 mg total) by mouth 2 (two) times daily. One po bid x 7 days 10/10/15   Ripley Fraise, MD  NON FORMULARY Janel birth control    Historical Provider, MD  ondansetron (ZOFRAN ODT) 4 MG disintegrating tablet Take 1 tablet (4 mg total) by mouth every 8 (eight) hours as needed for nausea. 12/21/15   Tanna Furry, MD    promethazine (PHENERGAN) 25 MG tablet Take 1 tablet (25 mg total) by mouth every 6 (six) hours as needed for nausea or vomiting. 11/15/14   Fredia Sorrow, MD    Family History History reviewed. No pertinent family history.  Social History Social History  Substance Use Topics  . Smoking status: Current Every Day Smoker    Packs/day: 0.50    Types: Cigarettes  . Smokeless tobacco: Never Used  . Alcohol use No     Allergies   Codeine   Review of Systems Review of Systems  Constitutional: Positive for fever. Negative for appetite change, chills, diaphoresis and fatigue.  HENT: Positive for dental problem, ear pain and sore throat. Negative for mouth sores and trouble swallowing.   Eyes: Negative for visual disturbance.  Respiratory: Positive for cough. Negative for chest tightness, shortness of breath and wheezing.   Cardiovascular: Negative for chest pain.  Gastrointestinal: Negative for abdominal distention, abdominal pain, diarrhea, nausea and vomiting.  Endocrine: Negative for polydipsia, polyphagia and polyuria.  Genitourinary: Negative for dysuria, frequency and hematuria.  Musculoskeletal: Negative for gait problem.  Skin: Negative for color change, pallor and rash.  Neurological: Negative for dizziness, syncope, light-headedness and headaches.  Hematological: Does not bruise/bleed easily.  Psychiatric/Behavioral: Negative for behavioral problems and confusion.  Physical Exam Updated Vital Signs BP 115/58   Pulse 65   Temp 98.4 F (36.9 C)   Resp 16   Ht 5\' 5"  (1.651 m)   Wt 126 lb (57.2 kg)   LMP 10/21/2015   SpO2 100%   BMI 20.97 kg/m   Physical Exam  Constitutional: She is oriented to person, place, and time. She appears well-developed and well-nourished. No distress.  HENT:  Head: Normocephalic.  Normal pharynx. Extraction site appears without complication. No active bleeding. No abscess or swelling. No adenopathy of the neck. Normal TMs.  Eyes:  Conjunctivae are normal. Pupils are equal, round, and reactive to light. No scleral icterus.  Neck: Normal range of motion. Neck supple. No thyromegaly present.  Cardiovascular: Normal rate and regular rhythm.  Exam reveals no gallop and no friction rub.   No murmur heard. Pulmonary/Chest: Effort normal and breath sounds normal. No respiratory distress. She has no wheezes. She has no rales.  Clear bilateral breath sounds. No wheezing rales or rhonchi.  Abdominal: Soft. Bowel sounds are normal. She exhibits no distension. There is no tenderness. There is no rebound.  Musculoskeletal: Normal range of motion.  Neurological: She is alert and oriented to person, place, and time.  Skin: Skin is warm and dry. No rash noted.  Psychiatric: She has a normal mood and affect. Her behavior is normal.     ED Treatments / Results  Labs (all labs ordered are listed, but only abnormal results are displayed) Labs Reviewed - No data to display  EKG  EKG Interpretation None       Radiology Dg Chest 2 View  Result Date: 12/21/2015 CLINICAL DATA:  Cough, congestion and fever EXAM: CHEST  2 VIEW COMPARISON:  None. FINDINGS: Cardiomediastinal contours are normal. No pneumothorax or pleural effusion. No focal airspace consolidation or pulmonary edema. IMPRESSION: No focal airspace consolidation. Electronically Signed   By: Ulyses Jarred M.D.   On: 12/21/2015 14:16    Procedures Procedures (including critical care time)  Medications Ordered in ED Medications  ondansetron (ZOFRAN-ODT) disintegrating tablet 4 mg (4 mg Oral Given 12/21/15 1416)     Initial Impression / Assessment and Plan / ED Course  I have reviewed the triage vital signs and the nursing notes.  Pertinent labs & imaging results that were available during my care of the patient were reviewed by me and considered in my medical decision making (see chart for details).  Clinical Course    Normal x-ray. Symptoms consistent with  viral syndrome. Continue her amoxicillin rx'd post her extraction. Tessalon for cough. Zofran for nausea.  Final Clinical Impressions(s) / ED Diagnoses   Final diagnoses:  Viral syndrome    New Prescriptions New Prescriptions   BENZONATATE (TESSALON) 100 MG CAPSULE    Take 1 capsule (100 mg total) by mouth every 8 (eight) hours.   ONDANSETRON (ZOFRAN ODT) 4 MG DISINTEGRATING TABLET    Take 1 tablet (4 mg total) by mouth every 8 (eight) hours as needed for nausea.     Tanna Furry, MD 12/21/15 [phone]

## 2015-12-21 NOTE — ED Triage Notes (Signed)
Pt c/o flu like symptoms x 2 weeks, fever chills cough n/v/d

## 2015-12-21 NOTE — ED Notes (Signed)
Pt reports cough, sore throat that started two weeks ago. Pt also had an abscessed tooth and is currently being tx with abx. Pt states she had the tooth removed today "emergently" and was told to come to the ED if fever persists. Pt states temp was 101 earlier today and she took aleve. Pt also reports N/V/D started 2 days ago.

## 2016-03-07 ENCOUNTER — Encounter (HOSPITAL_BASED_OUTPATIENT_CLINIC_OR_DEPARTMENT_OTHER): Payer: Self-pay | Admitting: *Deleted

## 2016-03-07 ENCOUNTER — Emergency Department (HOSPITAL_BASED_OUTPATIENT_CLINIC_OR_DEPARTMENT_OTHER)
Admission: EM | Admit: 2016-03-07 | Discharge: 2016-03-07 | Disposition: A | Discharge status: Home / Self Care | Payer: Medicaid Other | Attending: Emergency Medicine | Admitting: Emergency Medicine

## 2016-03-07 DIAGNOSIS — F17210 Nicotine dependence, cigarettes, uncomplicated: Secondary | ICD-10-CM | POA: Insufficient documentation

## 2016-03-07 DIAGNOSIS — N3000 Acute cystitis without hematuria: Secondary | ICD-10-CM | POA: Insufficient documentation

## 2016-03-07 LAB — URINALYSIS, ROUTINE W REFLEX MICROSCOPIC
Glucose, UA: NEGATIVE mg/dL
HGB URINE DIPSTICK: NEGATIVE
Ketones, ur: 15 mg/dL — AB
Nitrite: POSITIVE — AB
PROTEIN: NEGATIVE mg/dL
Specific Gravity, Urine: 1.01 (ref 1.005–1.030)
pH: 5 (ref 5.0–8.0)

## 2016-03-07 LAB — URINALYSIS, MICROSCOPIC (REFLEX)

## 2016-03-07 LAB — PREGNANCY, URINE: PREG TEST UR: NEGATIVE

## 2016-03-07 MED ORDER — ONDANSETRON HCL 4 MG PO TABS: 4.0000 mg | ORAL_TABLET | Freq: Four times a day (QID) | ORAL | 0 refills | Status: DC

## 2016-03-07 MED ORDER — CEPHALEXIN 500 MG PO CAPS: 500.0000 mg | ORAL_CAPSULE | Freq: Three times a day (TID) | ORAL | 0 refills | Status: DC

## 2016-03-07 MED ORDER — KETOROLAC TROMETHAMINE 30 MG/ML IJ SOLN
30.0000 mg | Freq: Once | INTRAMUSCULAR | Status: AC
  Administered 2016-03-07: 30 mg via INTRAMUSCULAR
  Filled 2016-03-07: qty 1

## 2016-03-07 MED ORDER — ONDANSETRON HCL 8 MG PO TABS
4.0000 mg | ORAL_TABLET | Freq: Once | ORAL | Status: AC
  Administered 2016-03-07: 4 mg via ORAL
  Filled 2016-03-07: qty 1

## 2016-03-07 MED ORDER — CEPHALEXIN 250 MG PO CAPS
500.0000 mg | ORAL_CAPSULE | Freq: Once | ORAL | Status: AC
  Administered 2016-03-07: 500 mg via ORAL
  Filled 2016-03-07: qty 2

## 2016-03-07 NOTE — ED Triage Notes (Signed)
Dysuria x 2 days. She has been using AZO.

## 2016-03-07 NOTE — Discharge Instructions (Signed)
Please take the antibiotics as prescribed for 10 days. You may also take the nausea medicine as needed. This is likely a urinary tract infection. Follow up with your primary care doctor in regards to today visit. Return to the Ed if you develop fevers, worsening vomiting, worsening pain, or for any other reason.

## 2016-03-08 LAB — URINE CULTURE

## 2016-03-08 NOTE — ED Provider Notes (Signed)
[facility] DEPT [facility] Provider Note   CSN: [id number] Arrival date & time: 03/07/16  1448     History   Chief Complaint Chief Complaint  Patient presents with  . Dysuria    HPI Tracey French is a 35 y.o. female.  35 year old African-American female past medical history significant for kidney stones, UTIs presents to the ED today with dysuria 2 days. Patient states that for the past 2 days she has had burning while she urinates along with urgency or frequency. Patient denies any hematuria. Patient states that she has had UTIs in the past and this feels similar. She also endorses mild right-sided flank pain and intermittent nausea. Denies any emesis. Patient states that she is sexually active with one female partner and uses protection. Patient denies any vaginal bleeding or vaginal discharge. She's been taking Azo to help with the dysuria. States this does help. Nothing makes worse. She denies any fever, chills, headache, vision changes, emesis, abdominal pain, change in bowel habits.      Past Medical History:  Diagnosis Date  . Fibroids   . Kidney stone   . Seizures ([place])     There are no active problems to display for this patient.   Past Surgical History:  Procedure Laterality Date  . KIDNEY STONE SURGERY    . TUBAL LIGATION      OB History    No data available       Home Medications    Prior to Admission medications   Medication Sig Start Date End Date Taking? Authorizing Provider  benzonatate (TESSALON) 100 MG capsule Take 1 capsule (100 mg total) by mouth every 8 (eight) hours. 12/21/15   Tanna Furry, MD  cephALEXin (KEFLEX) 500 MG capsule Take 1 capsule (500 mg total) by mouth 3 (three) times daily. 03/07/16   Doristine Devoid, PA-C  ibuprofen (ADVIL,MOTRIN) 600 MG tablet Take 1 tablet (600 mg total) by mouth every 6 (six) hours as needed. 01/14/15   Julianne Rice, MD  metroNIDAZOLE (FLAGYL) 500 MG tablet Take 1 tablet (500 mg total) by mouth 2 (two)  times daily. One po bid x 7 days 10/10/15   Ripley Fraise, MD  naproxen (NAPROSYN) 500 MG tablet Take 1 tablet (500 mg total) by mouth 2 (two) times daily. 11/15/14   Fredia Sorrow, MD  NON FORMULARY Janel birth control    Historical Provider, MD  ondansetron (ZOFRAN ODT) 4 MG disintegrating tablet Take 1 tablet (4 mg total) by mouth every 8 (eight) hours as needed for nausea. 12/21/15   Tanna Furry, MD  ondansetron (ZOFRAN) 4 MG tablet Take 1 tablet (4 mg total) by mouth every 6 (six) hours. 03/07/16   Doristine Devoid, PA-C  promethazine (PHENERGAN) 25 MG tablet Take 1 tablet (25 mg total) by mouth every 6 (six) hours as needed for nausea or vomiting. 11/15/14   Fredia Sorrow, MD    Family History No family history on file.  Social History Social History  Substance Use Topics  . Smoking status: Current Every Day Smoker    Packs/day: 0.50    Types: Cigarettes  . Smokeless tobacco: Never Used  . Alcohol use No     Allergies   Codeine   Review of Systems Review of Systems  Constitutional: Negative for chills and fever.  HENT: Negative for congestion.   Respiratory: Negative for cough and shortness of breath.   Cardiovascular: Negative for chest pain.  Gastrointestinal: Negative for abdominal pain, diarrhea, nausea and vomiting.  Genitourinary:  Positive for dysuria, frequency and urgency. Negative for vaginal bleeding and vaginal discharge.  All other systems reviewed and are negative.    Physical Exam Updated Vital Signs BP 104/77 (BP Location: Right Arm)   Pulse 60   Temp 97.9 F (36.6 C) (Oral)   Resp 18   Ht 5\' 5"  (1.651 m)   Wt 60.8 kg   LMP 02/27/2016   SpO2 96%   BMI 22.30 kg/m   Physical Exam  Constitutional: She is oriented to person, place, and time. She appears well-developed and well-nourished. No distress.  HENT:  Head: Normocephalic and atraumatic.  Eyes: Pupils are equal, round, and reactive to light. Right eye exhibits no discharge. Left eye  exhibits no discharge. No scleral icterus.  Neck: Normal range of motion. Neck supple.  Cardiovascular: Normal rate and regular rhythm.   Pulmonary/Chest: Effort normal. No respiratory distress.  Abdominal: Soft. Bowel sounds are normal. She exhibits no distension. There is no tenderness. There is no rebound and no guarding.  No CVA tenderness.  Musculoskeletal: Normal range of motion.  Lymphadenopathy:    She has no cervical adenopathy.  Neurological: She is alert and oriented to person, place, and time.  Skin: Skin is warm and dry. Capillary refill takes less than 2 seconds. No pallor.  Nursing note and vitals reviewed.    ED Treatments / Results  Labs (all labs ordered are listed, but only abnormal results are displayed) Labs Reviewed  URINALYSIS, ROUTINE W REFLEX MICROSCOPIC - Abnormal; Notable for the following:       Result Value   Color, Urine RED (*)    APPearance CLOUDY (*)    Bilirubin Urine SMALL (*)    Ketones, ur 15 (*)    Nitrite POSITIVE (*)    Leukocytes, UA LARGE (*)    All other components within normal limits  URINALYSIS, MICROSCOPIC (REFLEX) - Abnormal; Notable for the following:    Bacteria, UA RARE (*)    Squamous Epithelial / LPF 6-30 (*)    All other components within normal limits  URINE CULTURE  PREGNANCY, URINE    EKG  EKG Interpretation None       Radiology No results found.  Procedures Procedures (including critical care time)  Medications Ordered in ED Medications  ketorolac (TORADOL) 30 MG/ML injection 30 mg (30 mg Intramuscular Given 03/07/16 1614)  cephALEXin (KEFLEX) capsule 500 mg (500 mg Oral Given 03/07/16 1611)  ondansetron (ZOFRAN) tablet 4 mg (4 mg Oral Given 03/07/16 1611)     Initial Impression / Assessment and Plan / ED Course  I have reviewed the triage vital signs and the nursing notes.  Pertinent labs & imaging results that were available during my care of the patient were reviewed by me and considered in my  medical decision making (see chart for details).  Clinical Course   Pt has been diagnosed with a UTI. Pt is afebrile, no CVA tenderness, normotensive. Differential includes pyelonephritis however patient is afebrile. Urine consistent with signs of infection however patient has been taking Azo which could alter results. Urine culture has been sent. Patient with history of UTIs states this feels similar. Nephrolithiasis also on the differential however no blood noted in urine. We'll treat with 10 day course of Keflex. Have given small dose of nausea medicine. Patient is hemodynamically stable and is afebrile in the ED. Have given her Toradol to help with the back pain. Have given her strict return precautions. Pt to be dc home with antibiotics and instructions  to follow up with PCP if symptoms persist. Discussed patient with Dr. Rex Kras who agrees with the above plan.   Final Clinical Impressions(s) / ED Diagnoses   Final diagnoses:  Acute cystitis without hematuria    New Prescriptions Discharge Medication List as of 03/07/2016  4:09 PM    START taking these medications   Details  cephALEXin (KEFLEX) 500 MG capsule Take 1 capsule (500 mg total) by mouth 3 (three) times daily., Starting Fri 03/07/2016, Print    ondansetron (ZOFRAN) 4 MG tablet Take 1 tablet (4 mg total) by mouth every 6 (six) hours., Starting Fri 03/07/2016, Print         Doristine Devoid, PA-C 03/08/16 [place], MD 03/12/16 1125

## 2016-04-01 ENCOUNTER — Encounter (HOSPITAL_BASED_OUTPATIENT_CLINIC_OR_DEPARTMENT_OTHER): Payer: Self-pay | Admitting: *Deleted

## 2016-04-01 ENCOUNTER — Emergency Department (HOSPITAL_BASED_OUTPATIENT_CLINIC_OR_DEPARTMENT_OTHER)
Admission: EM | Admit: 2016-04-01 | Discharge: 2016-04-01 | Disposition: A | Discharge status: Home / Self Care | Payer: Medicaid Other | Attending: Emergency Medicine | Admitting: Emergency Medicine

## 2016-04-01 DIAGNOSIS — A599 Trichomoniasis, unspecified: Secondary | ICD-10-CM

## 2016-04-01 DIAGNOSIS — A5900 Urogenital trichomoniasis, unspecified: Secondary | ICD-10-CM | POA: Insufficient documentation

## 2016-04-01 DIAGNOSIS — F17210 Nicotine dependence, cigarettes, uncomplicated: Secondary | ICD-10-CM | POA: Insufficient documentation

## 2016-04-01 DIAGNOSIS — N730 Acute parametritis and pelvic cellulitis: Secondary | ICD-10-CM

## 2016-04-01 DIAGNOSIS — N739 Female pelvic inflammatory disease, unspecified: Secondary | ICD-10-CM | POA: Insufficient documentation

## 2016-04-01 LAB — URINALYSIS, ROUTINE W REFLEX MICROSCOPIC
BILIRUBIN URINE: NEGATIVE
Glucose, UA: NEGATIVE mg/dL
HGB URINE DIPSTICK: NEGATIVE
KETONES UR: NEGATIVE mg/dL
Nitrite: NEGATIVE
PROTEIN: NEGATIVE mg/dL
Specific Gravity, Urine: 1.02 (ref 1.005–1.030)
pH: 7.5 (ref 5.0–8.0)

## 2016-04-01 LAB — PREGNANCY, URINE: PREG TEST UR: NEGATIVE

## 2016-04-01 LAB — WET PREP, GENITAL
SPERM: NONE SEEN
Yeast Wet Prep HPF POC: NONE SEEN

## 2016-04-01 LAB — URINALYSIS, MICROSCOPIC (REFLEX)

## 2016-04-01 MED ORDER — CEFTRIAXONE SODIUM 250 MG IJ SOLR
250.0000 mg | Freq: Once | INTRAMUSCULAR | Status: AC
  Administered 2016-04-01: 250 mg via INTRAMUSCULAR
  Filled 2016-04-01: qty 250

## 2016-04-01 MED ORDER — METRONIDAZOLE 500 MG PO TABS: 500.0000 mg | ORAL_TABLET | Freq: Three times a day (TID) | ORAL | 0 refills | Status: AC

## 2016-04-01 MED ORDER — DOXYCYCLINE HYCLATE 100 MG PO CAPS: 100.0000 mg | ORAL_CAPSULE | Freq: Two times a day (BID) | ORAL | 0 refills | Status: AC

## 2016-04-01 MED ORDER — AZITHROMYCIN 1 G PO PACK
1.0000 g | PACK | Freq: Once | ORAL | Status: AC
  Administered 2016-04-01: 1 g via ORAL
  Filled 2016-04-01: qty 1

## 2016-04-01 NOTE — ED Provider Notes (Signed)
[facility] DEPT Provider Note   CSN: [id number] Arrival date & time: 04/01/16  [id number]     History   Chief Complaint Chief Complaint  Patient presents with  . Recurrent UTI    HPI Tracey French is a 36 y.o. female.  HPI   Had UTI a few weeks ago, however this time began with the same symptoms again but worse Abdominal pain in lower part of stomach and right flank. Lower pain on both sides, now has been there for last week.  White vaginal discharge, not sure if yeast.  No itching.  Sexually active, no condoms. No known concerns for STI.  Headache for 4 days.   No n/v/diarrhea/fever. Does have some right flank pain, constant. Worse with walking.  Past Medical History:  Diagnosis Date  . Fibroids   . Kidney stone   . Seizures ([place])     There are no active problems to display for this patient.   Past Surgical History:  Procedure Laterality Date  . KIDNEY STONE SURGERY    . TUBAL LIGATION      OB History    No data available       Home Medications    Prior to Admission medications   Medication Sig Start Date End Date Taking? Authorizing Provider  doxycycline (VIBRAMYCIN) 100 MG capsule Take 1 capsule (100 mg total) by mouth 2 (two) times daily. 04/01/16 04/15/16  Gareth Morgan, MD  metroNIDAZOLE (FLAGYL) 500 MG tablet Take 1 tablet (500 mg total) by mouth 3 (three) times daily. 04/01/16 04/15/16  Gareth Morgan, MD    Family History History reviewed. No pertinent family history.  Social History Social History  Substance Use Topics  . Smoking status: Current Every Day Smoker    Packs/day: 0.50    Types: Cigarettes  . Smokeless tobacco: Never Used  . Alcohol use No     Allergies   Codeine   Review of Systems Review of Systems  Constitutional: Negative for fever.  HENT: Negative for sore throat.   Eyes: Negative for visual disturbance.  Respiratory: Negative for cough and shortness of breath.   Cardiovascular: Negative for chest pain.    Gastrointestinal: Positive for abdominal pain and nausea (taking zofran which helps). Negative for constipation, diarrhea and vomiting.  Genitourinary: Positive for dysuria, flank pain and vaginal discharge. Negative for difficulty urinating and vaginal bleeding.  Musculoskeletal: Negative for back pain and neck pain.  Skin: Negative for rash.  Neurological: Negative for syncope and headaches.     Physical Exam Updated Vital Signs BP 121/80 (BP Location: Right Arm)   Pulse 67   Temp 98.1 F (36.7 C) (Oral)   Resp 20   LMP 02/11/2016   SpO2 99%   Physical Exam  Constitutional: She is oriented to person, place, and time. She appears well-developed and well-nourished. No distress.  HENT:  Head: Normocephalic and atraumatic.  Eyes: Conjunctivae and EOM are normal.  Neck: Normal range of motion.  Cardiovascular: Normal rate, regular rhythm, normal heart sounds and intact distal pulses.  Exam reveals no gallop and no friction rub.   No murmur heard. Pulmonary/Chest: Effort normal and breath sounds normal. No respiratory distress. She has no wheezes. She has no rales.  Abdominal: Soft. She exhibits no distension. There is tenderness (mild suprapubic). There is no guarding.  Genitourinary: Uterus is tender. Cervix exhibits motion tenderness and discharge. Left adnexum displays tenderness. Vaginal discharge found.  Musculoskeletal: She exhibits no edema or tenderness.  Neurological: She is alert and  oriented to person, place, and time.  Skin: Skin is warm and dry. No rash noted. She is not diaphoretic. No erythema.  Nursing note and vitals reviewed.    ED Treatments / Results  Labs (all labs ordered are listed, but only abnormal results are displayed) Labs Reviewed  WET PREP, GENITAL - Abnormal; Notable for the following:       Result Value   Trich, Wet Prep PRESENT (*)    Clue Cells Wet Prep HPF POC PRESENT (*)    WBC, Wet Prep HPF POC MANY (*)    All other components within  normal limits  URINALYSIS, ROUTINE W REFLEX MICROSCOPIC - Abnormal; Notable for the following:    APPearance CLOUDY (*)    Leukocytes, UA SMALL (*)    All other components within normal limits  URINALYSIS, MICROSCOPIC (REFLEX) - Abnormal; Notable for the following:    Bacteria, UA MANY (*)    Squamous Epithelial / LPF 6-30 (*)    All other components within normal limits  PREGNANCY, URINE  GC/CHLAMYDIA PROBE AMP ([place]) NOT AT [facility]    EKG  EKG Interpretation None       Radiology No results found.  Procedures Procedures (including critical care time)  Medications Ordered in ED Medications  cefTRIAXone (ROCEPHIN) injection 250 mg (250 mg Intramuscular Given 04/01/16 1141)  azithromycin (ZITHROMAX) powder 1 g (1 g Oral Given 04/01/16 1141)     Initial Impression / Assessment and Plan / ED Course  I have reviewed the triage vital signs and the nursing notes.  Pertinent labs & imaging results that were available during my care of the patient were reviewed by me and considered in my medical decision making (see chart for details).     36 year old female presents with concern for pelvic pain. Patient was diagnosed with a urinary tract infection 2 weeks ago, completed antibiotics with some improvement, however symptoms returned. Culture from urinalysis reviewed and shows no sign of UTI. Doubt appendicitis, TOA, torsion by hx and physical exam. Urine today shows possible contamination. Preg negative. Patient also describes vaginal discharge. Pelvic exam done showing significant cervical motion tenderness as well as uterine tenderness. Wet prep is positive for Trichomonas. Patient was given result Rocephin, azithromycin, and 2 weeks of Flagyl and doxycycline for concern of pelvic inflammatory disease.  Final Clinical Impressions(s) / ED Diagnoses   Final diagnoses:  PID (acute pelvic inflammatory disease)  Trichomonas infection    New Prescriptions Discharge Medication  List as of 04/01/2016 12:04 PM    START taking these medications   Details  doxycycline (VIBRAMYCIN) 100 MG capsule Take 1 capsule (100 mg total) by mouth 2 (two) times daily., Starting Tue 04/01/2016, Until Tue 04/15/2016, Print    metroNIDAZOLE (FLAGYL) 500 MG tablet Take 1 tablet (500 mg total) by mouth 3 (three) times daily., Starting Tue 04/01/2016, Until Tue 04/15/2016, Print         Gareth Morgan, MD 04/02/16 1253

## 2016-04-01 NOTE — ED Triage Notes (Signed)
dysuria x several days.  Urinary retention.  Right flank pain.  Reports white vaginal discharge.  States that she completed her antibiotics 1.5 weeks ago for treatment for uti.

## 2016-04-02 LAB — GC/CHLAMYDIA PROBE AMP (~~LOC~~) NOT AT ARMC
Chlamydia: NEGATIVE
Neisseria Gonorrhea: NEGATIVE

## 2016-05-21 ENCOUNTER — Encounter (HOSPITAL_BASED_OUTPATIENT_CLINIC_OR_DEPARTMENT_OTHER): Payer: Self-pay | Admitting: Emergency Medicine

## 2016-05-21 ENCOUNTER — Emergency Department (HOSPITAL_BASED_OUTPATIENT_CLINIC_OR_DEPARTMENT_OTHER)
Admission: EM | Admit: 2016-05-21 | Discharge: 2016-05-21 | Disposition: A | Discharge status: Home / Self Care | Payer: Medicaid Other | Attending: Emergency Medicine | Admitting: Emergency Medicine

## 2016-05-21 DIAGNOSIS — Y93F2 Activity, caregiving, lifting: Secondary | ICD-10-CM | POA: Insufficient documentation

## 2016-05-21 DIAGNOSIS — F17210 Nicotine dependence, cigarettes, uncomplicated: Secondary | ICD-10-CM | POA: Insufficient documentation

## 2016-05-21 DIAGNOSIS — Y999 Unspecified external cause status: Secondary | ICD-10-CM | POA: Insufficient documentation

## 2016-05-21 DIAGNOSIS — X500XXA Overexertion from strenuous movement or load, initial encounter: Secondary | ICD-10-CM | POA: Insufficient documentation

## 2016-05-21 DIAGNOSIS — S39012A Strain of muscle, fascia and tendon of lower back, initial encounter: Secondary | ICD-10-CM | POA: Insufficient documentation

## 2016-05-21 DIAGNOSIS — Y929 Unspecified place or not applicable: Secondary | ICD-10-CM | POA: Insufficient documentation

## 2016-05-21 LAB — URINALYSIS, ROUTINE W REFLEX MICROSCOPIC
Bilirubin Urine: NEGATIVE
GLUCOSE, UA: NEGATIVE mg/dL
HGB URINE DIPSTICK: NEGATIVE
KETONES UR: NEGATIVE mg/dL
Leukocytes, UA: NEGATIVE
Nitrite: NEGATIVE
PH: 7.5 (ref 5.0–8.0)
PROTEIN: NEGATIVE mg/dL
Specific Gravity, Urine: 1.017 (ref 1.005–1.030)

## 2016-05-21 LAB — PREGNANCY, URINE: Preg Test, Ur: NEGATIVE

## 2016-05-21 MED ORDER — NAPROXEN 500 MG PO TABS: 500.0000 mg | ORAL_TABLET | Freq: Two times a day (BID) | ORAL | 0 refills | Status: DC

## 2016-05-21 MED ORDER — HYDROCODONE-ACETAMINOPHEN 5-325 MG PO TABS
1.0000 | ORAL_TABLET | Freq: Once | ORAL | Status: AC
  Administered 2016-05-21: 1 via ORAL
  Filled 2016-05-21: qty 1

## 2016-05-21 MED ORDER — METHOCARBAMOL 500 MG PO TABS: 500.0000 mg | ORAL_TABLET | Freq: Two times a day (BID) | ORAL | 0 refills | Status: DC

## 2016-05-21 MED ORDER — HYDROCODONE-ACETAMINOPHEN 5-325 MG PO TABS: 1.0000 | ORAL_TABLET | Freq: Four times a day (QID) | ORAL | 0 refills | Status: DC | PRN

## 2016-05-21 NOTE — ED Notes (Signed)
ED Provider at bedside. 

## 2016-05-21 NOTE — ED Provider Notes (Signed)
[facility] Provider Note   CSN: [id number] Arrival date & time: 05/21/16  1256     History   Chief Complaint Chief Complaint  Patient presents with  . Back Pain    HPI Tracey French is a 36 y.o. female.  HPI Tracey French is a 36 y.o. female presents to emergency department complaining of lower back pain. Patient states she has had back pain in the past, states it has worsened since yesterday. 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 She states she did have to lift them yesterday and reports pain in the lower back at that time. She states pain is worsened overnight and she was unable to sleep. States pain radiates into the right leg. Denies any numbness or weakness in the leg. Denies any abdominal pain. No urinary symptoms. No hematuria. She does report history of kidney stones but states she is not sure if this feels similar to that. Denies nausea or vomiting. No changes in bowels. No vaginal discharge or bleeding. Pain is worsened with any movement. She has not tried any medications for this prior to coming in.  Past Medical History:  Diagnosis Date  . Fibroids   . Kidney stone   . Seizures ([place])     There are no active problems to display for this patient.   Past Surgical History:  Procedure Laterality Date  . KIDNEY STONE SURGERY    . TUBAL LIGATION      OB History    No data available       Home Medications    Prior to Admission medications   Not on File    Family History History reviewed. No pertinent family history.  Social History Social History  Substance Use Topics  . Smoking status: Current Every Day Smoker    Packs/day: 0.50    Types: Cigarettes  . Smokeless tobacco: Never Used  . Alcohol use No     Allergies   Codeine   Review of Systems Review of Systems  Constitutional: Negative for chills  and fever.  Respiratory: Negative for cough, chest tightness and shortness of breath.   Cardiovascular: Negative for chest pain, palpitations and leg swelling.  Gastrointestinal: Negative for abdominal pain, diarrhea, nausea and vomiting.  Genitourinary: Positive for flank pain. Negative for difficulty urinating, dysuria, frequency and hematuria.  Musculoskeletal: Positive for back pain. Negative for arthralgias, myalgias, neck pain and neck stiffness.  Skin: Negative for rash.  Neurological: Negative for dizziness, weakness, numbness and headaches.  All other systems reviewed and are negative.    Physical Exam Updated Vital Signs BP 113/74   Pulse (!) 55   Temp 97.8 F (36.6 C) (Oral)   Resp 18   Ht 5\' 5"  (1.651 m)   Wt 60.8 kg   LMP 04/27/2016   SpO2 100%   BMI 22.30 kg/m   Physical Exam  Constitutional: She appears well-developed and well-nourished. No distress.  HENT:  Head: Normocephalic.  Eyes: Conjunctivae are normal.  Neck: Neck supple.  Cardiovascular: Normal rate, regular rhythm and normal heart sounds.   Pulmonary/Chest: Effort normal and breath sounds normal. No respiratory distress. She has no wheezes. She has no rales.  Abdominal: Soft. Bowel sounds are normal. She exhibits no distension. There is no tenderness. There is no rebound.  Right cva tenderness  Musculoskeletal: She exhibits no edema.  No midline thoracic or lumbar spine tenderness. TTP over right paralumbar spinal muscles. Pain with right straight leg raise.   Neurological: She is alert.  5/5 and equal lower extremity strength. 2+ and equal patellar reflexes bilaterally. Pt able to dorsiflex bilateral toes and feet with good strength against resistance. Equal sensation bilaterally over thighs and lower legs.   Skin: Skin is warm and dry.  Psychiatric: She has a normal mood and affect. Her behavior is normal.  Nursing note and vitals reviewed.    ED Treatments / Results  Labs (all labs ordered  are listed, but only abnormal results are displayed) Labs Reviewed  URINALYSIS, ROUTINE W REFLEX MICROSCOPIC - Abnormal; Notable for the following:       Result Value   APPearance CLOUDY (*)    All other components within normal limits  PREGNANCY, URINE    EKG  EKG Interpretation None       Radiology No results found.  Procedures Procedures (including critical care time)  Medications Ordered in ED Medications  HYDROcodone-acetaminophen (NORCO/VICODIN) 5-325 MG per tablet 1 tablet (not administered)     Initial Impression / Assessment and Plan / ED Course  I have reviewed the triage vital signs and the nursing notes.  Pertinent labs & imaging results that were available during my care of the patient were reviewed by me and considered in my medical decision making (see chart for details).   patient emergency department with right-sided lower back pain. Denies any injuries but states she did lifts the head the patient yesterday and felt some pain in her lower back then. She does report history of kidney stones, however denies any urinary symptoms at this time. Will check urinalysis. She is afebrile, otherwise nontoxic-appearing. Her pain is clearly reproducible with palpation of the lower back. Suspect musculoskeletal pain. She has no abdominal pain. Will give Vicodin for pain here   Patient's urinalysis is unremarkable. I have low suspicion for kidney stone given no radiation to the abdomen of the pain and clearly reproducible pain with palpation of the lower back. She is neurovascularly intact. She is ambulatory. Will try muscle relaxant, 10 tablets of Norco, naproxen. Instructed not to lift anything heavy. Follow-up with family doctor. Return precautions discussed.  Vitals:   05/21/16 1305 05/21/16 1306 05/21/16 1602  BP: 113/74  110/64  Pulse: (!) 55  60  Resp: 18  16  Temp: 97.8 F (36.6 C)    TempSrc: Oral    SpO2: 100%  100%  Weight:  60.8 kg   Height:  5\' 5"   (1.651 m)       Final Clinical Impressions(s) / ED Diagnoses   Final diagnoses:  Strain of lumbar region, initial encounter    New Prescriptions Discharge Medication List as of 05/21/2016  3:51 PM    START taking these medications   Details  HYDROcodone-acetaminophen (NORCO/VICODIN) 5-325 MG tablet Take 1 tablet by mouth every 6 (six) hours as needed., Starting Wed 05/21/2016, Print    methocarbamol (ROBAXIN) 500 MG tablet Take 1 tablet (500 mg total) by mouth 2 (two) times daily., Starting Wed 05/21/2016, Print    naproxen (NAPROSYN) 500 MG tablet Take 1 tablet (500 mg total) by mouth 2 (two) times daily., Starting Wed 05/21/2016, Print         [place], PA-C 05/21/16 [place], MD 05/29/16 [phone]

## 2016-05-21 NOTE — Discharge Instructions (Signed)
Try heating pads, stretches. Take naproxen as prescribed. Take Norco for severe pain only. Take Robaxin as prescribed for muscle spasms. Please follow-up with family doctor for recheck if not improving. Return if any worsening symptoms, numbness or weakness to extremities, loss of bladder or bowel control, fever, abdominal pain.

## 2016-05-21 NOTE — ED Triage Notes (Signed)
Pt states back pain since last night, probably from lifting at work

## 2016-08-22 IMAGING — CT CT ABD-PELV W/ CM
2 of 4 series · 16 of 46 positions shown, 18 images · IV contrast (APPLIED)
Comparison: None.

CLINICAL DATA: Hematuria and rectal pain. Abnormal uterine
bleeding. Symptoms for 2 weeks.

EXAM:
CT ABDOMEN AND PELVIS WITH CONTRAST
TECHNIQUE: Multidetector CT imaging of the abdomen and pelvis was performed
using the standard protocol following bolus administration of
intravenous contrast.
CONTRAST:  50 mL OMNIPAQUE IOHEXOL 300 MG/ML SOLN, 100 mL OMNIPAQUE
IOHEXOL 300 MG/ML SOLN

[Series 2: abd/pelvis 5.0 b31f · axial · 0.56mm/px · z∈[+663,+1053]mm · 13 of 86 slices shown, 15 images]
[im 4/86  soft-tissue]
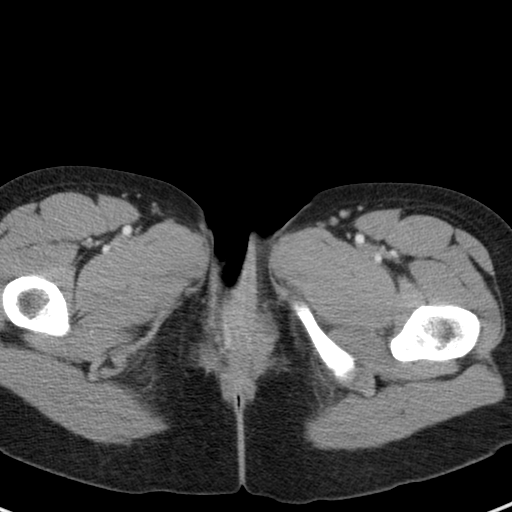
[im 4/86  bone]
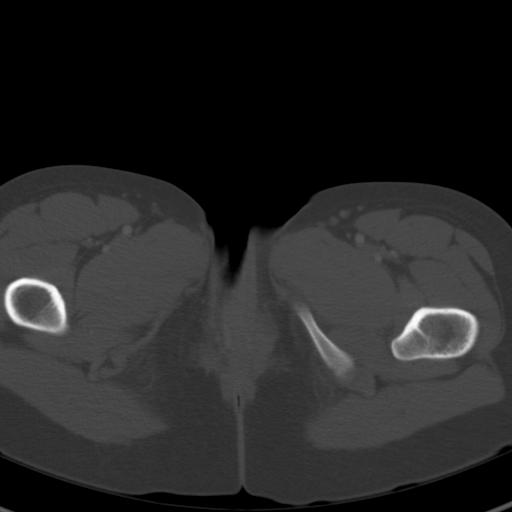
[im 10/86  soft-tissue]
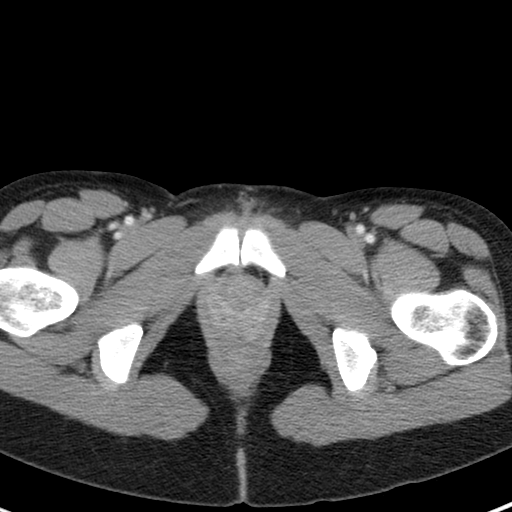
[im 17/86  soft-tissue]
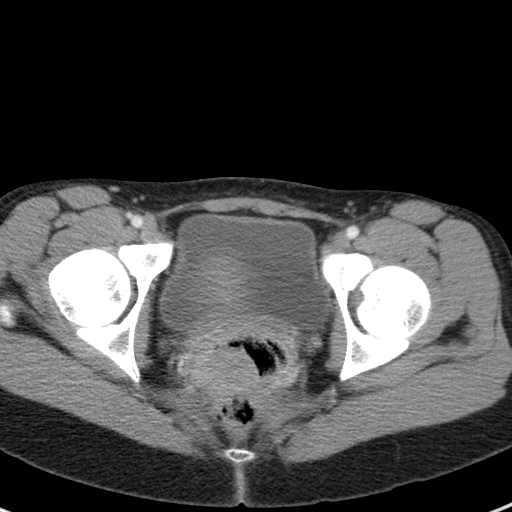
[im 23/86  soft-tissue]
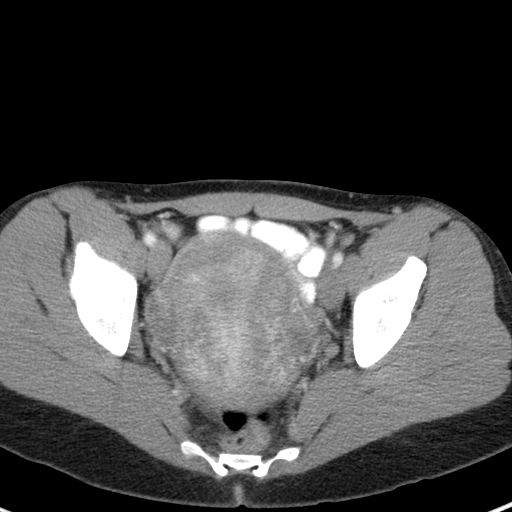
[im 30/86  soft-tissue]
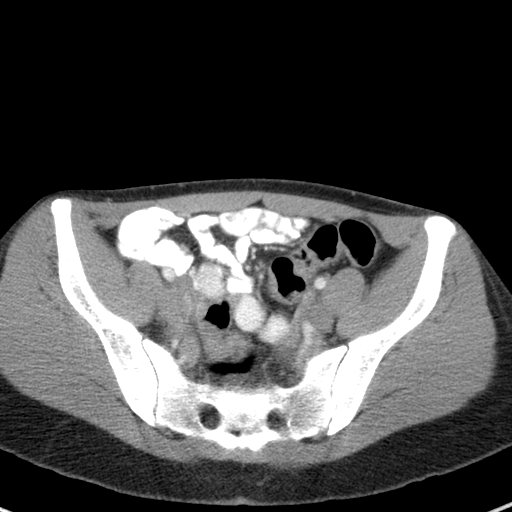
[im 36/86  soft-tissue]
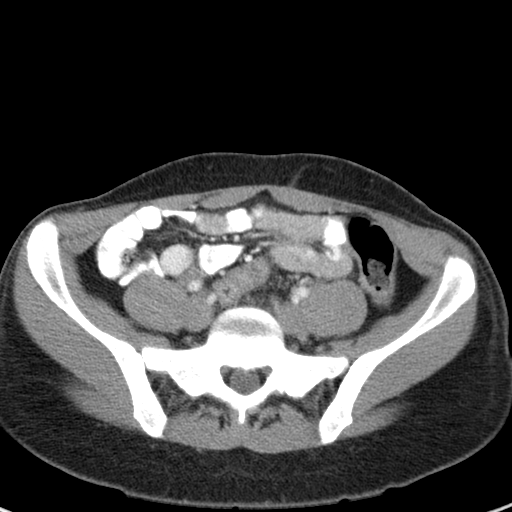
[im 43/86  soft-tissue]
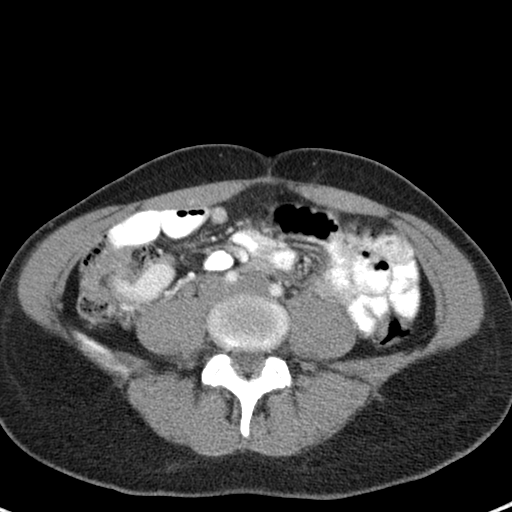
[im 50/86  soft-tissue]
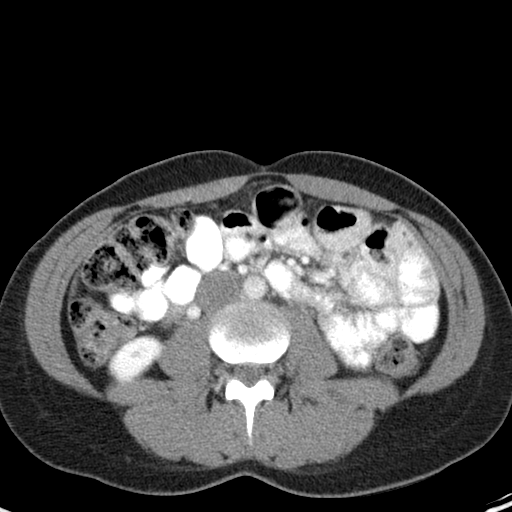
[im 56/86  soft-tissue]
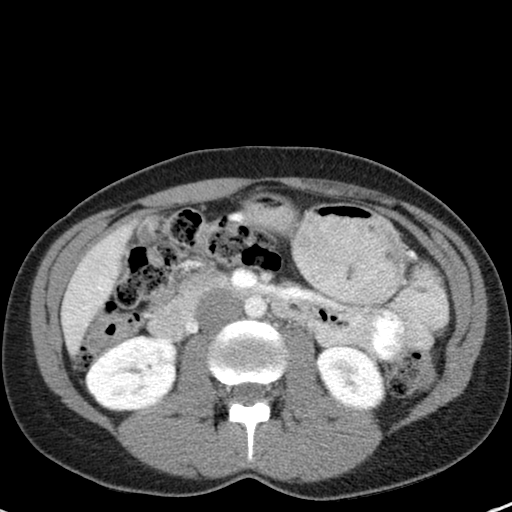
[im 56/86  bone]
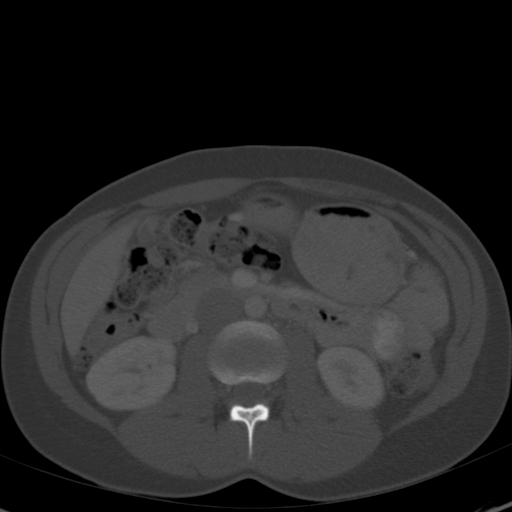
[im 63/86  soft-tissue]
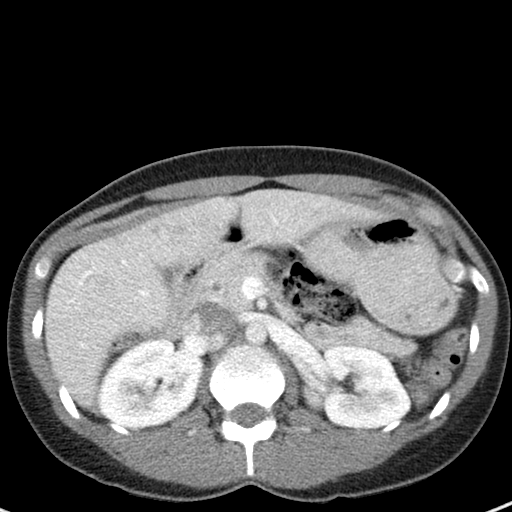
[im 69/86  soft-tissue]
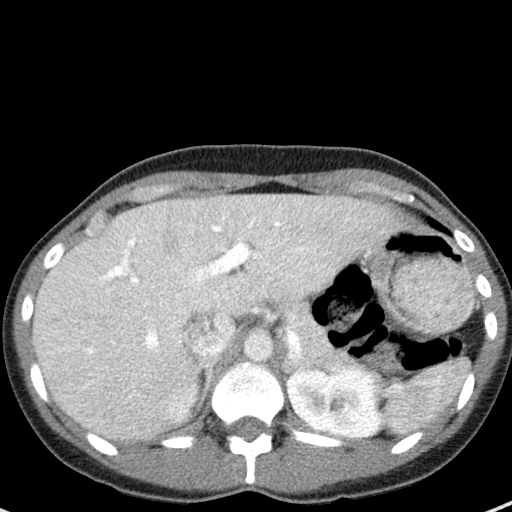
[im 76/86  soft-tissue]
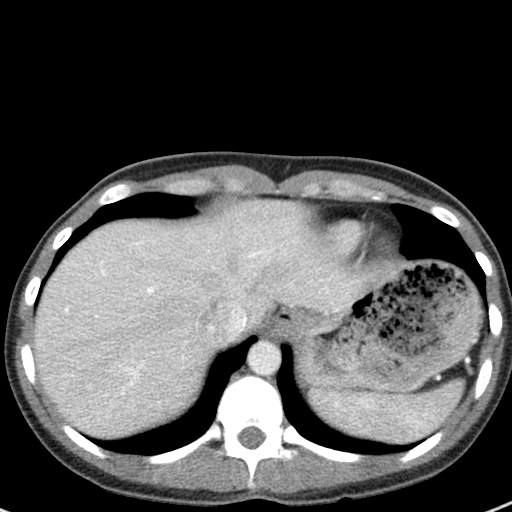
[im 82/86  soft-tissue]
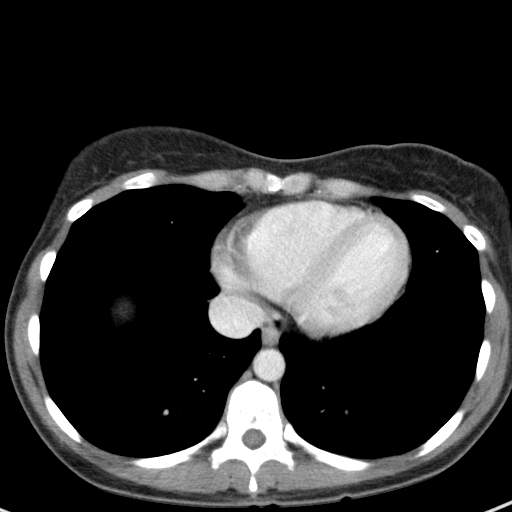

[Series 5: abd/pelvis 3.0 coronal · coronal · 0.62mm/px · 3 of 66 slices shown]
[im 22/66  soft-tissue]
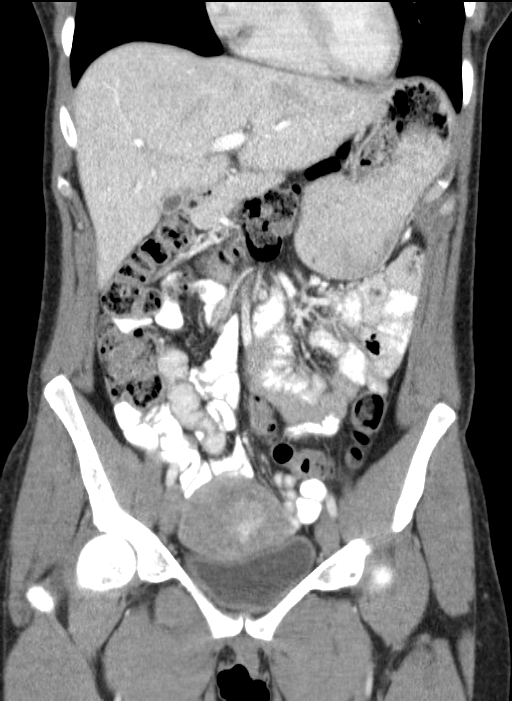
[im 29/66  soft-tissue]
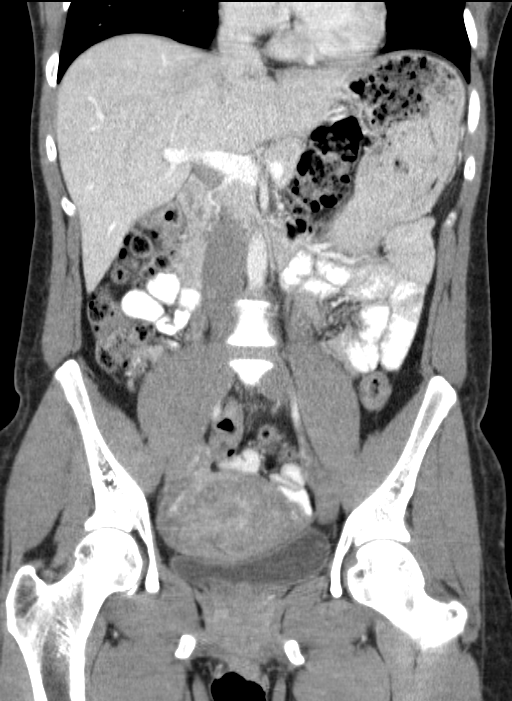
[im 37/66  soft-tissue]
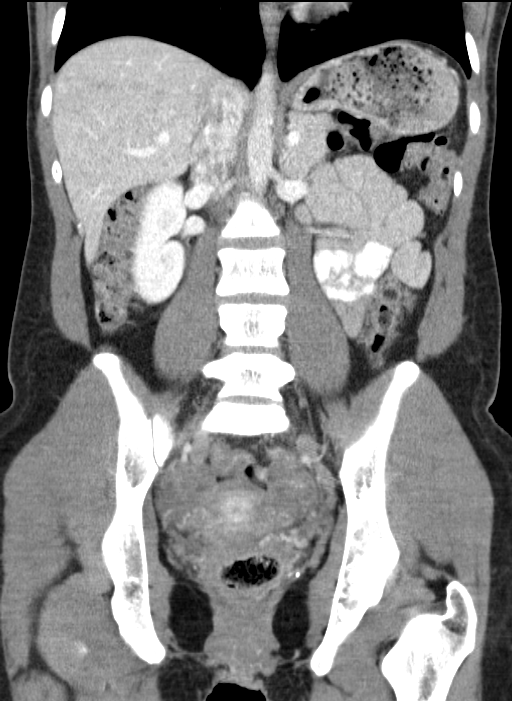

[16 of 46 positions shown; findings below may reference images not displayed]

FINDINGS: The lung bases are clear.  No pleural or pericardial effusion.

The gallbladder, liver, spleen, adrenal glands, pancreas and kidneys
appear normal.

Uterus, adnexa and urinary bladder are unremarkable. The stomach,
small and large bowel and appendix appear normal. There is no
lymphadenopathy or fluid.

No bony abnormality is identified.
IMPRESSION: Negative CT abdomen and pelvis. No finding to explain the patient's
symptoms.

## 2016-11-21 ENCOUNTER — Encounter (HOSPITAL_BASED_OUTPATIENT_CLINIC_OR_DEPARTMENT_OTHER): Payer: Self-pay | Admitting: Emergency Medicine

## 2016-11-21 ENCOUNTER — Emergency Department (HOSPITAL_BASED_OUTPATIENT_CLINIC_OR_DEPARTMENT_OTHER)
Admission: EM | Admit: 2016-11-21 | Discharge: 2016-11-21 | Disposition: A | Discharge status: Home / Self Care | Payer: Self-pay | Attending: Emergency Medicine | Admitting: Emergency Medicine

## 2016-11-21 DIAGNOSIS — F17210 Nicotine dependence, cigarettes, uncomplicated: Secondary | ICD-10-CM | POA: Insufficient documentation

## 2016-11-21 DIAGNOSIS — Z79899 Other long term (current) drug therapy: Secondary | ICD-10-CM | POA: Insufficient documentation

## 2016-11-21 DIAGNOSIS — A599 Trichomoniasis, unspecified: Secondary | ICD-10-CM

## 2016-11-21 DIAGNOSIS — N939 Abnormal uterine and vaginal bleeding, unspecified: Secondary | ICD-10-CM

## 2016-11-21 DIAGNOSIS — N938 Other specified abnormal uterine and vaginal bleeding: Secondary | ICD-10-CM | POA: Insufficient documentation

## 2016-11-21 LAB — CBC
HCT: 42.5 % (ref 36.0–46.0)
HEMOGLOBIN: 14.6 g/dL (ref 12.0–15.0)
MCH: 30.6 pg (ref 26.0–34.0)
MCHC: 34.4 g/dL (ref 30.0–36.0)
MCV: 89.1 fL (ref 78.0–100.0)
Platelets: 191 10*3/uL (ref 150–400)
RBC: 4.77 MIL/uL (ref 3.87–5.11)
RDW: 14.2 % (ref 11.5–15.5)
WBC: 9.6 10*3/uL (ref 4.0–10.5)

## 2016-11-21 LAB — WET PREP, GENITAL
SPERM: NONE SEEN
YEAST WET PREP: NONE SEEN

## 2016-11-21 LAB — PREGNANCY, URINE: Preg Test, Ur: NEGATIVE

## 2016-11-21 MED ORDER — AZITHROMYCIN 1 G PO PACK
1.0000 g | PACK | Freq: Once | ORAL | Status: AC
  Administered 2016-11-21: 1 g via ORAL
  Filled 2016-11-21: qty 1

## 2016-11-21 MED ORDER — METRONIDAZOLE 500 MG PO TABS
2000.0000 mg | ORAL_TABLET | Freq: Once | ORAL | Status: AC
  Administered 2016-11-21: 2000 mg via ORAL
  Filled 2016-11-21: qty 4

## 2016-11-21 MED ORDER — CEFTRIAXONE SODIUM 250 MG IJ SOLR
250.0000 mg | Freq: Once | INTRAMUSCULAR | Status: AC
  Administered 2016-11-21: 250 mg via INTRAMUSCULAR
  Filled 2016-11-21: qty 250

## 2016-11-21 NOTE — Discharge Instructions (Signed)
You can take a multivitamin called containing iron during your menstrual period help with your blood levels. If you develop significant dizziness and lightheadedness and continue to have significant vaginal bleeding for many days you can return here. However I would recommend that you follow-up with a gynecologist as they will be able to further evaluate you for this irregular bleeding that you have had for several years. Please have your partner treated for Trichomonas as well and have him checked out for other possible STD.

## 2016-11-21 NOTE — ED Provider Notes (Signed)
[place] DEPT [facility] Provider Note   CSN: [id number] Arrival date & time: 11/21/16  0901     History   Chief Complaint Chief Complaint  Patient presents with  . Vaginal Bleeding    HPI Tracey French is a 36 y.o. female.  HPI  Patient past medical history of bilateral tubal ligation presenting with vaginal bleeding.Patient state she has a hx of irregular periods. Last menstrual period in December, patient was at an acute care back June and state they told her she had positive urine pregnancy test, though she has had a bilateral tubal ligation, this pregnancy test was negative of repeat. Patient received a vaginal exam and was told she needed for follow up with OBGYN. Yesterday, patient started her menstrual period, this seemed like her regular menstrual periods. However, this morning patient had significant bleeding when patient got up out the bed. Patient indicates feeling a little lightheaded. Denies any vaginal discharge, vaginal irritation, dyspareunia. Denies any pelvic pain.   Past Medical History:  Diagnosis Date  . Fibroids   . Kidney stone   . Seizures ([place])     There are no active problems to display for this patient.   Past Surgical History:  Procedure Laterality Date  . KIDNEY STONE SURGERY    . TUBAL LIGATION      OB History    No data available       Home Medications    Prior to Admission medications   Medication Sig Start Date End Date Taking? Authorizing Provider  HYDROcodone-acetaminophen (NORCO/VICODIN) 5-325 MG tablet Take 1 tablet by mouth every 6 (six) hours as needed. 05/21/16   Kirichenko, Lahoma Rocker, PA-C  methocarbamol (ROBAXIN) 500 MG tablet Take 1 tablet (500 mg total) by mouth 2 (two) times daily. 05/21/16   Kirichenko, Tatyana, PA-C  naproxen (NAPROSYN) 500 MG tablet Take 1 tablet (500 mg total) by mouth 2 (two) times daily. 05/21/16   Jeannett Senior, PA-C    Family History No family history on file.  Social History Social  History  Substance Use Topics  . Smoking status: Current Every Day Smoker    Packs/day: 0.50    Types: Cigarettes  . Smokeless tobacco: Never Used  . Alcohol use Yes     Comment: occ     Allergies   Codeine   Review of Systems Review of Systems  Constitutional: Negative for chills and fever.  Gastrointestinal: Negative for abdominal pain, nausea and vomiting.  Genitourinary: Positive for vaginal bleeding. Negative for dyspareunia, dysuria, urgency, vaginal discharge and vaginal pain.     Physical Exam Updated Vital Signs BP 103/76 (BP Location: Left Arm)   Pulse (!) 54   Temp 98.5 F (36.9 C) (Oral)   Ht 5\' 5"  (1.651 m)   Wt 61.2 kg (135 lb)   SpO2 100%   BMI 22.47 kg/m   Physical Exam  Constitutional: She is oriented to person, place, and time. She appears well-developed and well-nourished.  HENT:  Head: Normocephalic and atraumatic.  Eyes: Pupils are equal, round, and reactive to light. Conjunctivae are normal.  Neck: Normal range of motion.  Cardiovascular: Normal rate.   Pulmonary/Chest: Effort normal.  Abdominal: Soft. Bowel sounds are normal. There is no tenderness.  Genitourinary: Vagina normal and uterus normal. Cervix exhibits no motion tenderness, no discharge and no friability. No tenderness in the vagina. No vaginal discharge found.  Musculoskeletal: Normal range of motion.  Neurological: She is alert and oriented to person, place, and time.  ED Treatments / Results  Labs (all labs ordered are listed, but only abnormal results are displayed) Labs Reviewed  WET PREP, GENITAL - Abnormal; Notable for the following:       Result Value   Trich, Wet Prep PRESENT (*)    Clue Cells Wet Prep HPF POC PRESENT (*)    WBC, Wet Prep HPF POC MODERATE (*)    All other components within normal limits  CBC  PREGNANCY, URINE  RPR  HIV ANTIBODY (ROUTINE TESTING)  GC/CHLAMYDIA PROBE AMP ([place]) NOT AT [facility]    EKG  EKG Interpretation None        Radiology No results found.  Procedures Procedures (including critical care time)  Medications Ordered in ED Medications  cefTRIAXone (ROCEPHIN) injection 250 mg (not administered)  azithromycin (ZITHROMAX) powder 1 g (not administered)  metroNIDAZOLE (FLAGYL) tablet 2,000 mg (not administered)     Initial Impression / Assessment and Plan / ED Course  I have reviewed the triage vital signs and the nursing notes.  Pertinent labs & imaging results that were available during my care of the patient were reviewed by me and considered in my medical decision making (see chart for details).  Patient presenting with 4 hours of heavy vaginal bleeding. Vitals within normal limits. Patient's CBC and normal limits. Patient was noted to have Trichomonas on wet prep. Patient was treated for this as well as prophylactically treated for gonorrhea and chlamydia. The patient encouraged to follow up with gynecology for further evaluation of her irregular periods.  Final Clinical Impressions(s) / ED Diagnoses   Final diagnoses:  None    New Prescriptions New Prescriptions   No medications on file     Tonette Bihari, MD 11/21/16 1038    Lajean Saver, MD 11/21/16 1229

## 2016-11-21 NOTE — ED Triage Notes (Signed)
Pt reports vaginal bleeding since yesterday; c/o mid lower abd pain; sts last period was around the end of 2017.

## 2016-11-22 LAB — HIV ANTIBODY (ROUTINE TESTING W REFLEX): HIV Screen 4th Generation wRfx: NONREACTIVE

## 2016-11-22 LAB — RPR: RPR: NONREACTIVE

## 2016-11-24 LAB — GC/CHLAMYDIA PROBE AMP (~~LOC~~) NOT AT ARMC
Chlamydia: NEGATIVE
NEISSERIA GONORRHEA: NEGATIVE

## 2017-02-27 ENCOUNTER — Emergency Department (HOSPITAL_BASED_OUTPATIENT_CLINIC_OR_DEPARTMENT_OTHER)
Admission: EM | Admit: 2017-02-27 | Discharge: 2017-02-27 | Disposition: A | Discharge status: Home / Self Care | Payer: Self-pay | Attending: Emergency Medicine | Admitting: Emergency Medicine

## 2017-02-27 ENCOUNTER — Encounter (HOSPITAL_BASED_OUTPATIENT_CLINIC_OR_DEPARTMENT_OTHER): Payer: Self-pay

## 2017-02-27 ENCOUNTER — Other Ambulatory Visit: Payer: Self-pay

## 2017-02-27 DIAGNOSIS — A5901 Trichomonal vulvovaginitis: Secondary | ICD-10-CM

## 2017-02-27 DIAGNOSIS — N760 Acute vaginitis: Secondary | ICD-10-CM | POA: Insufficient documentation

## 2017-02-27 DIAGNOSIS — F17210 Nicotine dependence, cigarettes, uncomplicated: Secondary | ICD-10-CM | POA: Insufficient documentation

## 2017-02-27 DIAGNOSIS — B9689 Other specified bacterial agents as the cause of diseases classified elsewhere: Secondary | ICD-10-CM | POA: Insufficient documentation

## 2017-02-27 LAB — COMPREHENSIVE METABOLIC PANEL
ALK PHOS: 52 U/L (ref 38–126)
ALT: 18 U/L (ref 14–54)
AST: 22 U/L (ref 15–41)
Albumin: 3.9 g/dL (ref 3.5–5.0)
Anion gap: 6 (ref 5–15)
BUN: 13 mg/dL (ref 6–20)
CHLORIDE: 103 mmol/L (ref 101–111)
CO2: 28 mmol/L (ref 22–32)
CREATININE: 0.76 mg/dL (ref 0.44–1.00)
Calcium: 9 mg/dL (ref 8.9–10.3)
GFR calc Af Amer: >60 mL/min (ref >60)
GLUCOSE: 90 mg/dL (ref 65–99)
POTASSIUM: 3.9 mmol/L (ref 3.5–5.1)
Sodium: 137 mmol/L (ref 135–145)
Total Bilirubin: 0.6 mg/dL (ref 0.3–1.2)
Total Protein: 7.4 g/dL (ref 6.5–8.1)

## 2017-02-27 LAB — CBC WITH DIFFERENTIAL/PLATELET
Basophils Absolute: 0 10*3/uL (ref 0.0–0.1)
Basophils Relative: 0 %
Eosinophils Absolute: 0.1 10*3/uL (ref 0.0–0.7)
Eosinophils Relative: 1 %
HEMATOCRIT: 45.5 % (ref 36.0–46.0)
HEMOGLOBIN: 15.5 g/dL — AB (ref 12.0–15.0)
LYMPHS ABS: 1.9 10*3/uL (ref 0.7–4.0)
Lymphocytes Relative: 23 %
MCH: 30.8 pg (ref 26.0–34.0)
MCHC: 34.1 g/dL (ref 30.0–36.0)
MCV: 90.3 fL (ref 78.0–100.0)
MONOS PCT: 9 %
Monocytes Absolute: 0.7 10*3/uL (ref 0.1–1.0)
NEUTROS ABS: 5.6 10*3/uL (ref 1.7–7.7)
NEUTROS PCT: 67 %
Platelets: 194 10*3/uL (ref 150–400)
RBC: 5.04 MIL/uL (ref 3.87–5.11)
RDW: 13.7 % (ref 11.5–15.5)
WBC: 8.2 10*3/uL (ref 4.0–10.5)

## 2017-02-27 LAB — URINALYSIS, ROUTINE W REFLEX MICROSCOPIC
Bilirubin Urine: NEGATIVE
GLUCOSE, UA: NEGATIVE mg/dL
Hgb urine dipstick: NEGATIVE
KETONES UR: NEGATIVE mg/dL
LEUKOCYTES UA: NEGATIVE
Nitrite: NEGATIVE
PH: 7 (ref 5.0–8.0)
Protein, ur: NEGATIVE mg/dL
Specific Gravity, Urine: 1.02 (ref 1.005–1.030)

## 2017-02-27 LAB — WET PREP, GENITAL
SPERM: NONE SEEN
Yeast Wet Prep HPF POC: NONE SEEN

## 2017-02-27 LAB — LIPASE, BLOOD: LIPASE: 30 U/L (ref 11–51)

## 2017-02-27 LAB — PREGNANCY, URINE: Preg Test, Ur: NEGATIVE

## 2017-02-27 MED ORDER — KETOROLAC TROMETHAMINE 60 MG/2ML IM SOLN
60.0000 mg | Freq: Once | INTRAMUSCULAR | Status: AC
  Administered 2017-02-27: 60 mg via INTRAMUSCULAR
  Filled 2017-02-27: qty 2

## 2017-02-27 MED ORDER — METRONIDAZOLE 500 MG PO TABS: 500.0000 mg | ORAL_TABLET | Freq: Two times a day (BID) | ORAL | 0 refills | Status: DC

## 2017-02-27 MED ORDER — LIDOCAINE HCL (PF) 1 % IJ SOLN
INTRAMUSCULAR | Status: AC
  Filled 2017-02-27: qty 5

## 2017-02-27 MED ORDER — DICYCLOMINE HCL 10 MG PO CAPS: 10.0000 mg | ORAL_CAPSULE | Freq: Once | ORAL | Status: DC

## 2017-02-27 MED ORDER — CEFTRIAXONE SODIUM 250 MG IJ SOLR
250.0000 mg | Freq: Once | INTRAMUSCULAR | Status: AC
  Administered 2017-02-27: 250 mg via INTRAMUSCULAR
  Filled 2017-02-27: qty 250

## 2017-02-27 MED ORDER — AZITHROMYCIN 250 MG PO TABS
1000.0000 mg | ORAL_TABLET | Freq: Once | ORAL | Status: AC
  Administered 2017-02-27: 1000 mg via ORAL
  Filled 2017-02-27: qty 4

## 2017-02-27 NOTE — Discharge Instructions (Addendum)
Take Flagyl as directed.  It is very important that you do not consume any alcohol while taking this medication as it will cause you to become violently ill.  Do not have any sexual intercourse until you complete your antibiotic therapy.  Your partners need to be made aware and treated.  Do not have any sexual intercourse until they have been treated or you have risk of reinfection.  As we discussed the gonorrhea and Chlamydia results do not come back for 2-3 days.  If they are abnormal, you will be contacted.  You have already been treated if they come back positive.  Follow-up with your primary care doctor for further evaluation.  Fever, worsening abdominal pain, vomiting symptoms.

## 2017-02-27 NOTE — ED Triage Notes (Signed)
C/o dysuria, freq x 3 days-NAD-steady gait

## 2017-02-27 NOTE — ED Notes (Signed)
ED Provider at bedside. 

## 2017-02-27 NOTE — ED Notes (Signed)
Pt was given a ginger ale for PO challenge 

## 2017-02-27 NOTE — ED Provider Notes (Signed)
[place] EMERGENCY DEPARTMENT Provider Note   CSN: [id number] Arrival date & time: 02/27/17  1113     History   Chief Complaint Chief Complaint  Patient presents with  . Dysuria    HPI Tracey French is a 36 y.o. female who presents for evaluation of dysuria and urinary frequency times 3 days.  Patient reports that 3 days ago she began having some increased urinary frequency where she felt like she had to go home and only have little urine output.  Patient reports today that she started having some dysuria, prompting ED visit.  Patient reports her last UTI was a year ago.  Patient also reports that over the last 2 days, she has had some lower abdominal pain.  She reports 2 episodes of diarrhea 2 days ago but none since.  Patient had one episode of vomiting yesterday.  She states that she has had no difficulties eating or drinking.  She denies any fevers, vaginal bleeding, vaginal discharge, chest pain, difficulty breathing.  The history is provided by the patient.    Past Medical History:  Diagnosis Date  . Fibroids   . Kidney stone   . Seizures ([place])     There are no active problems to display for this patient.   Past Surgical History:  Procedure Laterality Date  . KIDNEY STONE SURGERY    . TUBAL LIGATION      OB History    No data available       Home Medications    Prior to Admission medications   Medication Sig Start Date End Date Taking? Authorizing Provider  metroNIDAZOLE (FLAGYL) 500 MG tablet Take 1 tablet (500 mg total) by mouth 2 (two) times daily. 02/27/17   Volanda Napoleon, PA-C    Family History No family history on file.  Social History Social History   Tobacco Use  . Smoking status: Current Every Day Smoker    Packs/day: 0.50    Types: Cigarettes  . Smokeless tobacco: Never Used  Substance Use Topics  . Alcohol use: Yes    Comment: occ  . Drug use: No     Allergies   Codeine   Review of Systems Review of Systems    Constitutional: Negative for fever.  Respiratory: Negative for cough and shortness of breath.   Cardiovascular: Negative for chest pain.  Gastrointestinal: Positive for abdominal pain, blood in stool, nausea and vomiting.  Genitourinary: Negative for dysuria and hematuria.  Neurological: Negative for headaches.     Physical Exam Updated Vital Signs BP 102/68   Pulse 64   Temp 98.2 F (36.8 C) (Oral)   Resp 16   Ht 5\' 5"  (1.651 m)   Wt 61.2 kg (135 lb)   LMP 01/25/2017   SpO2 97%   BMI 22.47 kg/m   Physical Exam  Constitutional: She is oriented to person, place, and time. She appears well-developed and well-nourished.  Sitting comfortably on examination table  HENT:  Head: Normocephalic and atraumatic.  Mouth/Throat: Oropharynx is clear and moist and mucous membranes are normal.  Eyes: Conjunctivae, EOM and lids are normal. Pupils are equal, round, and reactive to light.  Neck: Full passive range of motion without pain.  Cardiovascular: Normal rate, regular rhythm, normal heart sounds and normal pulses. Exam reveals no gallop and no friction rub.  No murmur heard. Pulmonary/Chest: Effort normal and breath sounds normal.  Abdominal: Soft. Normal appearance and bowel sounds are normal. There is tenderness in the suprapubic area. There  is no rigidity, no guarding, no CVA tenderness and no tenderness at McBurney's point.  Abdomen is soft, nondistended.  Patient has some mild suprapubic tenderness. No McBurney's point tenderness.  No CVA tenderness bilaterally.  No peritoneal signs.  Genitourinary: Uterus normal. Cervix exhibits discharge. Cervix exhibits no motion tenderness. Right adnexum displays no mass and no tenderness. Left adnexum displays no mass and no tenderness. Vaginal discharge found.  Genitourinary Comments: The exam was performed with a chaperone present. Normal external female genitalia. No lesions, rash, or sores.  White, malodorous discharge noted.  No CMT.  No  adnexal mass or tenderness bilaterally.  Uterus normal.  Musculoskeletal: Normal range of motion.  Neurological: She is alert and oriented to person, place, and time.  Skin: Skin is warm and dry. Capillary refill takes less than 2 seconds.  Psychiatric: She has a normal mood and affect. Her speech is normal.  Nursing note and vitals reviewed.    ED Treatments / Results  Labs (all labs ordered are listed, but only abnormal results are displayed) Labs Reviewed  WET PREP, GENITAL - Abnormal; Notable for the following components:      Result Value   Trich, Wet Prep PRESENT (*)    Clue Cells Wet Prep HPF POC PRESENT (*)    WBC, Wet Prep HPF POC MANY (*)    All other components within normal limits  CBC WITH DIFFERENTIAL/PLATELET - Abnormal; Notable for the following components:   Hemoglobin 15.5 (*)    All other components within normal limits  URINE CULTURE  URINALYSIS, ROUTINE W REFLEX MICROSCOPIC  PREGNANCY, URINE  COMPREHENSIVE METABOLIC PANEL  LIPASE, BLOOD  GC/CHLAMYDIA PROBE AMP ([place]) NOT AT [facility]    EKG  EKG Interpretation None       Radiology No results found.  Procedures Procedures (including critical care time)  Medications Ordered in ED Medications  lidocaine (PF) (XYLOCAINE) 1 % injection (not administered)  ketorolac (TORADOL) injection 60 mg (60 mg Intramuscular Given 02/27/17 1333)  cefTRIAXone (ROCEPHIN) injection 250 mg (250 mg Intramuscular Given 02/27/17 1428)  azithromycin (ZITHROMAX) tablet 1,000 mg (1,000 mg Oral Given 02/27/17 1428)     Initial Impression / Assessment and Plan / ED Course  I have reviewed the triage vital signs and the nursing notes.  Pertinent labs & imaging results that were available during my care of the patient were reviewed by me and considered in my medical decision making (see chart for details).     36 year old female who presents for evaluation of urinary frequency and dysuria that is been ongoing 3 days.   Throughout further discussion, patient also reports she has been having abdominal pain.  She states that she had episode of diarrhea yesterday.  She states she had one episode of vomiting improved.  She has been eating and drinking today without any difficulty.  Patient denies any fevers. Patient is afebril, non-toxic appearing, sitting comfortably on examination table. Vital signs reviewed and stable.  Physical exam shows some mild suprapubic abdominal tenderness.  No McBurney's point tenderness.  No CVA tenderness.  Consider UTI versus gastro.  History/physical exam not concerning for pyelonephritis, kidney stone, appendicitis, diverticulitis.  Initial urine ordered at triage.  We will add additional labs and reevaluate. Analgesics provided in the department.  Labs reviewed.  CBC unremarkable.  CMP unremarkable.  Lipase within normal limits.  UA shows no evidence of acute infectious etiology.  Urine pregnancy negative.  Discussed results with patient.  She is still having some lower abdominal  tenderness but she has not received medications at.  I discussed results with her.  Initially, patient had told me she was not having any vaginal discharge.  Upon further discussion patient states that she had been having vaginal discharge.  She states she is currently sexually active with one partner.  They do not use protection.  Patient does have a history of trichomonas a couple months ago and states that she was treated for it.  Given concerns, will plan to do a pelvic exam.  Pelvic exam as documented above.  Patient did have some malodorous white discharge noted.  No CMT. exam is not concerning for PID.  No adnexal mass or tenderness.  Do not suspect ovarian torsion, ovarian cyst given history/physical exam.  Reevaluation after analgesics.  Patient reports improvement in abdominal pain.  Repeat abdominal exam shows tenderness has improved significantly.  Patient is tolerating palpation well.  We will plan to p.o.  challenge.  Wet prep pending.  I discussed with patient regarding the gonorrhea and Chlamydia results.  I informed patient that she could wait for the results to come back before being treated.  Patient states that she would rather be treated today.  I feel that this is reasonable.  Patient provided antibiotic treatment.  Reevaluation.  Patient still reports improvement in abdominal pain.  Repeat exam shows improved abdominal tenderness.  No McBurney's point tenderness.  Abdominal exam is not concerning for diverticulitis or appendicitis.  She was able to tolerate p.o. without any difficulty.  Given reassuring exam and normal labs, do not feel that patient needs CT imaging of this at this time.  I discussed this with patient.  She is agreeable.  Wet prep reviewed showed positive trichomonas and clue cells.  We will plan to give outpatient course of Flagyl.  Patient instructed to abstain from intercourse until she finishes her antibiotic therapies.  Discussed results with patient.  Patient instructed follow-up primary care doctor the next 24-48 hours for further evaluation. Patient had ample opportunity for questions and discussion. All patient's questions were answered with full understanding. Strict return precautions discussed. Patient expresses understanding and agreement to plan.     Final Clinical Impressions(s) / ED Diagnoses   Final diagnoses:  BV (bacterial vaginosis)  Trichomonal vaginitis    ED Discharge Orders        Ordered    metroNIDAZOLE (FLAGYL) 500 MG tablet  2 times daily     02/27/17 1450       Desma Mcgregor 02/27/17 1521    Quintella Reichert, MD 02/28/17 [phone]

## 2017-02-28 LAB — URINE CULTURE

## 2017-03-03 LAB — GC/CHLAMYDIA PROBE AMP (~~LOC~~) NOT AT ARMC
CHLAMYDIA, DNA PROBE: NEGATIVE
Neisseria Gonorrhea: NEGATIVE

## 2017-04-25 ENCOUNTER — Emergency Department (HOSPITAL_BASED_OUTPATIENT_CLINIC_OR_DEPARTMENT_OTHER): Payer: Self-pay

## 2017-04-25 ENCOUNTER — Emergency Department (HOSPITAL_BASED_OUTPATIENT_CLINIC_OR_DEPARTMENT_OTHER)
Admission: EM | Admit: 2017-04-25 | Discharge: 2017-04-25 | Disposition: A | Discharge status: Home / Self Care | Payer: Self-pay | Attending: Emergency Medicine | Admitting: Emergency Medicine

## 2017-04-25 ENCOUNTER — Other Ambulatory Visit: Payer: Self-pay

## 2017-04-25 ENCOUNTER — Encounter (HOSPITAL_BASED_OUTPATIENT_CLINIC_OR_DEPARTMENT_OTHER): Payer: Self-pay | Admitting: Emergency Medicine

## 2017-04-25 DIAGNOSIS — R197 Diarrhea, unspecified: Secondary | ICD-10-CM

## 2017-04-25 DIAGNOSIS — Z79899 Other long term (current) drug therapy: Secondary | ICD-10-CM | POA: Insufficient documentation

## 2017-04-25 DIAGNOSIS — E860 Dehydration: Secondary | ICD-10-CM | POA: Insufficient documentation

## 2017-04-25 DIAGNOSIS — F17210 Nicotine dependence, cigarettes, uncomplicated: Secondary | ICD-10-CM | POA: Insufficient documentation

## 2017-04-25 DIAGNOSIS — R112 Nausea with vomiting, unspecified: Secondary | ICD-10-CM

## 2017-04-25 LAB — URINALYSIS, ROUTINE W REFLEX MICROSCOPIC
Bilirubin Urine: NEGATIVE
Glucose, UA: NEGATIVE mg/dL
Ketones, ur: 15 mg/dL — AB
NITRITE: NEGATIVE
Protein, ur: NEGATIVE mg/dL
Specific Gravity, Urine: 1.02 (ref 1.005–1.030)
pH: 6 (ref 5.0–8.0)

## 2017-04-25 LAB — CBC
HCT: 44.2 % (ref 36.0–46.0)
Hemoglobin: 15.5 g/dL — ABNORMAL HIGH (ref 12.0–15.0)
MCH: 30.9 pg (ref 26.0–34.0)
MCHC: 35.1 g/dL (ref 30.0–36.0)
MCV: 88 fL (ref 78.0–100.0)
PLATELETS: 198 10*3/uL (ref 150–400)
RBC: 5.02 MIL/uL (ref 3.87–5.11)
RDW: 14 % (ref 11.5–15.5)
WBC: 7 10*3/uL (ref 4.0–10.5)

## 2017-04-25 LAB — COMPREHENSIVE METABOLIC PANEL
ALT: 26 U/L (ref 14–54)
AST: 23 U/L (ref 15–41)
Albumin: 3.9 g/dL (ref 3.5–5.0)
Alkaline Phosphatase: 50 U/L (ref 38–126)
Anion gap: 9 (ref 5–15)
BILIRUBIN TOTAL: 1 mg/dL (ref 0.3–1.2)
BUN: 10 mg/dL (ref 6–20)
CALCIUM: 8.8 mg/dL — AB (ref 8.9–10.3)
CO2: 21 mmol/L — ABNORMAL LOW (ref 22–32)
CREATININE: 0.77 mg/dL (ref 0.44–1.00)
Chloride: 105 mmol/L (ref 101–111)
GFR calc Af Amer: >60 mL/min (ref >60)
Glucose, Bld: 98 mg/dL (ref 65–99)
Potassium: 3.6 mmol/L (ref 3.5–5.1)
Sodium: 135 mmol/L (ref 135–145)
TOTAL PROTEIN: 7.3 g/dL (ref 6.5–8.1)

## 2017-04-25 LAB — URINALYSIS, MICROSCOPIC (REFLEX)

## 2017-04-25 LAB — PREGNANCY, URINE: PREG TEST UR: NEGATIVE

## 2017-04-25 LAB — LIPASE, BLOOD: Lipase: 28 U/L (ref 11–51)

## 2017-04-25 MED ORDER — IBUPROFEN 400 MG PO TABS
600.0000 mg | ORAL_TABLET | Freq: Once | ORAL | Status: AC
  Administered 2017-04-25: 600 mg via ORAL
  Filled 2017-04-25: qty 1

## 2017-04-25 MED ORDER — ONDANSETRON 4 MG PO TBDP: 4.0000 mg | ORAL_TABLET | Freq: Three times a day (TID) | ORAL | 0 refills | Status: DC | PRN

## 2017-04-25 MED ORDER — ACETAMINOPHEN 325 MG PO TABS
650.0000 mg | ORAL_TABLET | Freq: Once | ORAL | Status: AC
  Administered 2017-04-25: 650 mg via ORAL
  Filled 2017-04-25: qty 2

## 2017-04-25 MED ORDER — METOCLOPRAMIDE HCL 5 MG/ML IJ SOLN
10.0000 mg | Freq: Once | INTRAMUSCULAR | Status: AC
Start: 2017-04-25 — End: 2017-04-25
  Administered 2017-04-25: 10 mg via INTRAVENOUS
  Filled 2017-04-25: qty 2

## 2017-04-25 MED ORDER — METHOCARBAMOL 500 MG PO TABS: 500.0000 mg | ORAL_TABLET | Freq: Two times a day (BID) | ORAL | 0 refills | Status: DC

## 2017-04-25 MED ORDER — ONDANSETRON HCL 4 MG/2ML IJ SOLN
4.0000 mg | Freq: Once | INTRAMUSCULAR | Status: AC
  Administered 2017-04-25: 4 mg via INTRAVENOUS
  Filled 2017-04-25: qty 2

## 2017-04-25 MED ORDER — KETOROLAC TROMETHAMINE 15 MG/ML IJ SOLN
15.0000 mg | Freq: Once | INTRAMUSCULAR | Status: AC
  Administered 2017-04-25: 15 mg via INTRAVENOUS
  Filled 2017-04-25: qty 1

## 2017-04-25 MED ORDER — SODIUM CHLORIDE 0.9 % IV BOLUS (SEPSIS)
1000.0000 mL | Freq: Once | INTRAVENOUS | Status: AC
  Administered 2017-04-25: 1000 mL via INTRAVENOUS

## 2017-04-25 NOTE — ED Provider Notes (Signed)
[place] [facility] EMERGENCY DEPARTMENT Provider Note   CSN: [id number] Arrival date & time: 04/25/17  1634     History   Chief Complaint Chief Complaint  Patient presents with  . Back Pain  . Emesis    HPI Tracey French is a 37 y.o. female.  Patient is a 37 year old female with a history of seizures, kidney stones and fibroids presenting today with multiple complaints.  Patient states on Monday 5 days prior to arrival she developed left upper back pain and the next day started having cough, congestion, sore throat and some intermittent shortness of breath.  The back pain is worse with certain movements, deep breathing and certain positions.  She tried placing Tiger balm on it which did initially help but it is continued to get worse.  She denies any trauma or heavy lifting.  But he has no history of blood clots and no family history.  No unilateral leg pain or swelling.  No long trips or immobilization. Secondly today patient woke up with nausea vomiting and diarrhea.  Patient states this is been ongoing all day and she is not been able to hold anything down.  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  She denies any recent antibiotic use or travel.  No bad food exposure.  Last emesis and diarrhea was within an hour of arrival to the emergency room.  She denies any localized abdominal tenderness.  No urinary or vaginal symptoms.   The history is provided by the patient.    Past Medical History:  Diagnosis Date  . Fibroids   . Kidney stone   . Seizures ([place])     There are no active problems to display for this patient.   Past Surgical History:  Procedure Laterality Date  . KIDNEY STONE SURGERY    . TUBAL LIGATION      OB History    No data available       Home Medications    Prior to Admission medications   Medication Sig Start Date End Date Taking? Authorizing Provider  metroNIDAZOLE (FLAGYL) 500 MG tablet  Take 1 tablet (500 mg total) by mouth 2 (two) times daily. 02/27/17   Volanda Napoleon, PA-C    Family History No family history on file.  Social History Social History   Tobacco Use  . Smoking status: Current Every Day Smoker    Packs/day: 0.50    Types: Cigarettes  . Smokeless tobacco: Never Used  Substance Use Topics  . Alcohol use: Yes    Comment: occ  . Drug use: No     Allergies   Codeine   Review of Systems Review of Systems  All other systems reviewed and are negative.    Physical Exam Updated Vital Signs BP 110/70 (BP Location: Right Arm)   Pulse 64   Temp 98.7 F (37.1 C) (Oral)   Resp 20   Ht 5\' 5"  (1.651 m)   Wt 61.7 kg (136 lb)   LMP 03/19/2017   SpO2 98%   BMI 22.63 kg/m   Physical Exam  Constitutional: She is oriented to person, place, and time. She appears well-developed and well-nourished. No distress.  HENT:  Head: Normocephalic and atraumatic.  Mouth/Throat: Oropharynx is clear and moist. Mucous membranes are dry.  Eyes: Conjunctivae and EOM are normal. Pupils are equal, round, and reactive to light.  Neck: Normal range of motion. Neck supple.  Cardiovascular:  Normal rate, regular rhythm and intact distal pulses.  No murmur heard. Pulmonary/Chest: Effort normal and breath sounds normal. No respiratory distress. She has no wheezes. She has no rales.  Abdominal: Soft. She exhibits no distension. There is no tenderness. There is no rebound and no guarding.  Musculoskeletal: Normal range of motion. She exhibits tenderness. She exhibits no edema.       Arms: Neurological: She is alert and oriented to person, place, and time.  Skin: Skin is warm and dry. No rash noted. No erythema.  Psychiatric: She has a normal mood and affect. Her behavior is normal.  Nursing note and vitals reviewed.    ED Treatments / Results  Labs (all labs ordered are listed, but only abnormal results are displayed) Labs Reviewed  URINALYSIS, ROUTINE W REFLEX  MICROSCOPIC - Abnormal; Notable for the following components:      Result Value   APPearance HAZY (*)    Hgb urine dipstick SMALL (*)    Ketones, ur 15 (*)    Leukocytes, UA SMALL (*)    All other components within normal limits  URINALYSIS, MICROSCOPIC (REFLEX) - Abnormal; Notable for the following components:   Bacteria, UA FEW (*)    Squamous Epithelial / LPF 0-5 (*)    All other components within normal limits  COMPREHENSIVE METABOLIC PANEL - Abnormal; Notable for the following components:   CO2 21 (*)    Calcium 8.8 (*)    All other components within normal limits  CBC - Abnormal; Notable for the following components:   Hemoglobin 15.5 (*)    All other components within normal limits  PREGNANCY, URINE  LIPASE, BLOOD    EKG  EKG Interpretation None       Radiology Dg Chest 2 View  Result Date: 04/25/2017 CLINICAL DATA:  Left side low back pian x 6 days. Pain worsens w/ deep inhalation. nv/fever. No cough or congestion. Smoker. EXAM: CHEST  2 VIEW COMPARISON:  Chest x-ray dated 12/21/2015. FINDINGS: The heart size and mediastinal contours are within normal limits. Both lungs are clear. The visualized skeletal structures are unremarkable. IMPRESSION: No active cardiopulmonary disease. No evidence of pneumonia or pulmonary edema. Electronically Signed   By: Franki Cabot M.D.   On: 04/25/2017 20:37    Procedures Procedures (including critical care time)  Medications Ordered in ED Medications  metoCLOPramide (REGLAN) injection 10 mg (not administered)  acetaminophen (TYLENOL) tablet 650 mg (650 mg Oral Given 04/25/17 1645)  ondansetron (ZOFRAN) injection 4 mg (4 mg Intravenous Given 04/25/17 1916)  sodium chloride 0.9 % bolus 1,000 mL (0 mLs Intravenous Stopped 04/25/17 2048)  ibuprofen (ADVIL,MOTRIN) tablet 600 mg (600 mg Oral Given 04/25/17 1940)  ketorolac (TORADOL) 15 MG/ML injection 15 mg (15 mg Intravenous Given 04/25/17 2129)     Initial Impression / Assessment and  Plan / ED Course  I have reviewed the triage vital signs and the nursing notes.  Pertinent labs & imaging results that were available during my care of the patient were reviewed by me and considered in my medical decision making (see chart for details).     Patient presenting today with 2 separate symptoms.  Initially was flulike symptoms and left-sided back pain that started on Monday.  She has had a mildly productive cough and fever.  She denies any shortness of breath at this time.  Oxygen saturation within normal limits, vital signs are reassuring.  However patient then developed nausea vomiting and diarrhea since this morning.  She has had  positive sick contacts at work with the same exact thing.  She has a benign abdomen and low suspicion for appendicitis, pancreatitis, hepatitis or diverticulitis.  No peritoneal symptoms concerning for rupture.  She denies any high risk activities such as travel outside the [place] or antibiotic use.  Will give IV fluids and Zofran.  We will get a chest x-ray to ensure no evidence of pneumonia that may be causing her back pain but otherwise low risk for DVT or PE.  Symptoms do not seem cardiac in nature.  Low suspicion for dissection.  9:37 PM X-ray within normal limits, CBC without acute findings, CMP within normal limits.  Lipase within normal limits.  After p.o. challenge patient became nauseated again.  She was given Reglan and will continue to monitor.  10:41 PM Pt feeling much better and pain and nausea resolved.  Final Clinical Impressions(s) / ED Diagnoses   Final diagnoses:  Nausea vomiting and diarrhea  Dehydration    ED Discharge Orders        Ordered    ondansetron (ZOFRAN ODT) 4 MG disintegrating tablet  Every 8 hours PRN     04/25/17 2239    methocarbamol (ROBAXIN) 500 MG tablet  2 times daily     04/25/17 2239       Blanchie Dessert, MD 04/25/17 2242

## 2017-04-25 NOTE — ED Notes (Signed)
Alert, NAD, calm, interactive, resps e/u, speaking in clear complete sentences, no dyspnea noted, skin W&D, VSS, c/o back pain, (denies: sob, nausea, dizziness or visual changes).Family at [facility].

## 2017-04-25 NOTE — ED Notes (Addendum)
States Sx started when she got up for work at [employer]. Reports Co-workers have had similar SX

## 2017-04-25 NOTE — ED Triage Notes (Signed)
Back pain since Tuesday. Vomiting and diarrhea since this morning. Coworkers have had GI symptoms.

## 2017-04-25 NOTE — ED Notes (Signed)
Updated. Reports pain decreased, back easing up, but still hurts. 6/10. Alert, NAD, calm, interactive.

## 2017-05-22 ENCOUNTER — Emergency Department (HOSPITAL_BASED_OUTPATIENT_CLINIC_OR_DEPARTMENT_OTHER)
Admission: EM | Admit: 2017-05-22 | Discharge: 2017-05-22 | Disposition: A | Discharge status: Home / Self Care | Payer: Medicaid Other | Attending: Emergency Medicine | Admitting: Emergency Medicine

## 2017-05-22 ENCOUNTER — Encounter (HOSPITAL_BASED_OUTPATIENT_CLINIC_OR_DEPARTMENT_OTHER): Payer: Self-pay | Admitting: Emergency Medicine

## 2017-05-22 ENCOUNTER — Other Ambulatory Visit: Payer: Self-pay

## 2017-05-22 DIAGNOSIS — Z5321 Procedure and treatment not carried out due to patient leaving prior to being seen by health care provider: Secondary | ICD-10-CM | POA: Insufficient documentation

## 2017-05-22 DIAGNOSIS — H9201 Otalgia, right ear: Secondary | ICD-10-CM | POA: Insufficient documentation

## 2017-05-22 NOTE — ED Triage Notes (Signed)
Pt c/o right ear pain x 1 week

## 2017-05-22 NOTE — ED Notes (Signed)
Registration states pt is leaving.

## 2017-11-03 ENCOUNTER — Encounter (HOSPITAL_BASED_OUTPATIENT_CLINIC_OR_DEPARTMENT_OTHER): Payer: Self-pay

## 2017-11-03 ENCOUNTER — Emergency Department (HOSPITAL_BASED_OUTPATIENT_CLINIC_OR_DEPARTMENT_OTHER): Payer: Medicaid Other

## 2017-11-03 ENCOUNTER — Emergency Department (HOSPITAL_BASED_OUTPATIENT_CLINIC_OR_DEPARTMENT_OTHER)
Admission: EM | Admit: 2017-11-03 | Discharge: 2017-11-03 | Disposition: A | Discharge status: Home / Self Care | Payer: Medicaid Other | Attending: Emergency Medicine | Admitting: Emergency Medicine

## 2017-11-03 DIAGNOSIS — B9789 Other viral agents as the cause of diseases classified elsewhere: Secondary | ICD-10-CM | POA: Insufficient documentation

## 2017-11-03 DIAGNOSIS — F17210 Nicotine dependence, cigarettes, uncomplicated: Secondary | ICD-10-CM | POA: Insufficient documentation

## 2017-11-03 DIAGNOSIS — J069 Acute upper respiratory infection, unspecified: Secondary | ICD-10-CM

## 2017-11-03 DIAGNOSIS — R05 Cough: Secondary | ICD-10-CM | POA: Diagnosis present

## 2017-11-03 LAB — BASIC METABOLIC PANEL
ANION GAP: 8 (ref 5–15)
BUN: 7 mg/dL (ref 6–20)
CO2: 27 mmol/L (ref 22–32)
Calcium: 9.2 mg/dL (ref 8.9–10.3)
Chloride: 103 mmol/L (ref 98–111)
Creatinine, Ser: 0.84 mg/dL (ref 0.44–1.00)
Glucose, Bld: 104 mg/dL — ABNORMAL HIGH (ref 70–99)
POTASSIUM: 3.8 mmol/L (ref 3.5–5.1)
SODIUM: 138 mmol/L (ref 135–145)

## 2017-11-03 LAB — URINALYSIS, MICROSCOPIC (REFLEX)

## 2017-11-03 LAB — URINALYSIS, ROUTINE W REFLEX MICROSCOPIC
Bilirubin Urine: NEGATIVE
GLUCOSE, UA: NEGATIVE mg/dL
KETONES UR: NEGATIVE mg/dL
LEUKOCYTES UA: NEGATIVE
NITRITE: NEGATIVE
PROTEIN: NEGATIVE mg/dL
Specific Gravity, Urine: <1.005 — ABNORMAL LOW (ref 1.005–1.030)
pH: 5.5 (ref 5.0–8.0)

## 2017-11-03 LAB — CBC WITH DIFFERENTIAL/PLATELET
BASOS ABS: 0 10*3/uL (ref 0.0–0.1)
BASOS PCT: 0 %
EOS PCT: 2 %
Eosinophils Absolute: 0.1 10*3/uL (ref 0.0–0.7)
HCT: 45.7 % (ref 36.0–46.0)
Hemoglobin: 15.8 g/dL — ABNORMAL HIGH (ref 12.0–15.0)
Lymphocytes Relative: 22 %
Lymphs Abs: 1.6 10*3/uL (ref 0.7–4.0)
MCH: 30.7 pg (ref 26.0–34.0)
MCHC: 34.6 g/dL (ref 30.0–36.0)
MCV: 88.7 fL (ref 78.0–100.0)
MONO ABS: 0.7 10*3/uL (ref 0.1–1.0)
Monocytes Relative: 10 %
Neutro Abs: 5 10*3/uL (ref 1.7–7.7)
Neutrophils Relative %: 66 %
Platelets: 202 10*3/uL (ref 150–400)
RBC: 5.15 MIL/uL — AB (ref 3.87–5.11)
RDW: 13.9 % (ref 11.5–15.5)
WBC: 7.6 10*3/uL (ref 4.0–10.5)

## 2017-11-03 LAB — PREGNANCY, URINE: PREG TEST UR: NEGATIVE

## 2017-11-03 MED ORDER — LACTATED RINGERS IV BOLUS
1000.0000 mL | Freq: Once | INTRAVENOUS | Status: AC
  Administered 2017-11-03: 1000 mL via INTRAVENOUS

## 2017-11-03 MED ORDER — IBUPROFEN 800 MG PO TABS
800.0000 mg | ORAL_TABLET | Freq: Once | ORAL | Status: AC
  Administered 2017-11-03: 800 mg via ORAL
  Filled 2017-11-03: qty 1

## 2017-11-03 MED ORDER — ACETAMINOPHEN 325 MG PO TABS
650.0000 mg | ORAL_TABLET | Freq: Once | ORAL | Status: AC
  Administered 2017-11-03: 650 mg via ORAL
  Filled 2017-11-03: qty 2

## 2017-11-03 NOTE — ED Provider Notes (Signed)
[facility] EMERGENCY DEPARTMENT Provider Note   CSN: [id number] Arrival date & time: 11/03/17  0751     History   Chief Complaint Chief Complaint  Patient presents with  . Cough    HPI Tracey French is a 37 y.o. female.  The history is provided by the patient.  Fever   This is a new problem. The current episode started 2 days ago. The problem occurs constantly. The problem has been gradually worsening. The maximum temperature noted was 103 to 104 F. Associated symptoms include congestion, muscle aches and cough. Pertinent negatives include no chest pain, no vomiting and no sore throat. She has tried ibuprofen for the symptoms. The treatment provided mild relief.    Past Medical History:  Diagnosis Date  . Fibroids   . Kidney stone   . Seizures ([place])     There are no active problems to display for this patient.   Past Surgical History:  Procedure Laterality Date  . KIDNEY STONE SURGERY    . TUBAL LIGATION       OB History   None      Home Medications    Prior to Admission medications   Not on File    Family History No family history on file.  Social History Social History   Tobacco Use  . Smoking status: Current Every Day Smoker    Packs/day: 0.50    Types: Cigarettes  . Smokeless tobacco: Never Used  Substance Use Topics  . Alcohol use: Yes    Comment: occ  . Drug use: No     Allergies   Codeine   Review of Systems Review of Systems  Constitutional: Positive for fever. Negative for chills.  HENT: Positive for congestion. Negative for ear pain and sore throat.   Eyes: Negative for pain and visual disturbance.  Respiratory: Positive for cough. Negative for shortness of breath.   Cardiovascular: Negative for chest pain and palpitations.  Gastrointestinal: Negative for abdominal pain and vomiting.  Genitourinary: Positive for decreased urine volume. Negative for dysuria, flank pain and hematuria.  Musculoskeletal: Negative for  arthralgias and back pain.  Skin: Negative for color change and rash.  Neurological: Negative for seizures and syncope.  All other systems reviewed and are negative.    Physical Exam Updated Vital Signs  ED Triage Vitals [11/03/17 0756]  Enc Vitals Group     BP 109/76     Pulse Rate 65     Resp 18     Temp 98.4 F (36.9 C)     Temp Source Oral     SpO2 99 %     Weight 140 lb (63.5 kg)     Height 5\' 5"  (1.651 m)     Head Circumference      Peak Flow      Pain Score      Pain Loc      Pain Edu?      Excl. in [place]?      Physical Exam  Constitutional: She is oriented to person, place, and time. She appears well-developed and well-nourished. No distress.  Mildly ill-appearing, congested  HENT:  Head: Normocephalic and atraumatic.  Mouth/Throat: No oropharyngeal exudate.  Eyes: Pupils are equal, round, and reactive to light. Conjunctivae and EOM are normal.  Neck: Normal range of motion. Neck supple.  Cardiovascular: Normal rate, regular rhythm, normal heart sounds and intact distal pulses.  No murmur heard. Pulmonary/Chest: Effort normal and breath sounds normal. No respiratory distress.  Abdominal: Soft. Bowel sounds are normal. There is no tenderness.  Musculoskeletal: Normal range of motion. She exhibits no edema.  Neurological: She is alert and oriented to person, place, and time.  Skin: Skin is warm and dry. Capillary refill takes less than 2 seconds.  Psychiatric: She has a normal mood and affect.  Nursing note and vitals reviewed.    ED Treatments / Results  Labs (all labs ordered are listed, but only abnormal results are displayed) Labs Reviewed  CBC WITH DIFFERENTIAL/PLATELET - Abnormal; Notable for the following components:      Result Value   RBC 5.15 (*)    Hemoglobin 15.8 (*)    All other components within normal limits  BASIC METABOLIC PANEL - Abnormal; Notable for the following components:   Glucose, Bld 104 (*)    All other components within  normal limits  URINALYSIS, ROUTINE W REFLEX MICROSCOPIC - Abnormal; Notable for the following components:   APPearance HAZY (*)    Specific Gravity, Urine <1.005 (*)    Hgb urine dipstick TRACE (*)    All other components within normal limits  URINALYSIS, MICROSCOPIC (REFLEX) - Abnormal; Notable for the following components:   Bacteria, UA FEW (*)    All other components within normal limits  PREGNANCY, URINE    EKG None  Radiology Dg Chest 2 View  Result Date: 11/03/2017 CLINICAL DATA:  Flu like symptoms for the past week including cough and fever. Nonsmoker. EXAM: CHEST - 2 VIEW COMPARISON:  PA and lateral chest x-ray of April 25 2017 FINDINGS: The lungs remain hyperinflated with hemidiaphragm flattening. There is no focal infiltrate. There is no pleural effusion. The heart and pulmonary vascularity are normal. The mediastinum is normal in width. The bony thorax is unremarkable. IMPRESSION: Persistent hyperinflation which may reflect the patient's smoking history. No pneumonia, CHF, nor other acute cardiopulmonary abnormality. Electronically Signed   By: David  Martinique M.D.   On: 11/03/2017 08:57    Procedures Procedures (including critical care time)  Medications Ordered in ED Medications  lactated ringers bolus 1,000 mL (1,000 mLs Intravenous New Bag/Given 11/03/17 0824)  ibuprofen (ADVIL,MOTRIN) tablet 800 mg (800 mg Oral Given 11/03/17 0813)  acetaminophen (TYLENOL) tablet 650 mg (650 mg Oral Given 11/03/17 0813)     Initial Impression / Assessment and Plan / ED Course  I have reviewed the triage vital signs and the nursing notes.  Pertinent labs & imaging results that were available during my care of the patient were reviewed by me and considered in my medical decision making (see chart for details).     [name] is a 37 year old female with no significant medical history who presents to the ED with fever, cough, myalgias.  Patient with unremarkable vitals.  No  fever.  Patient with symptoms over the last 4 days.  Sounds viral in process.  However, patient has had cough with sputum production and will evaluate with chest x-ray and labs for pneumonia.  Has had some decreased urination and will give IV fluids for rehydration.  We will also check urinalysis as source of fever as well.  Patient with no abdominal pain, no sore throat.  No signs of the ear or throat infection on exam.  Patient given Motrin and Tylenol.  Patient improved following fluids and Tylenol/Motrin.  Lab work with no significant anemia, electrolyte abnormality, kidney injury.  Chest x-ray with no signs of pneumonia.  Urinalysis with no sign of infection. Likely ongoing viral process.  Recommend increase oral  hydration at home and continued use of Tylenol/Motrin.  Final Clinical Impressions(s) / ED Diagnoses   Final diagnoses:  Viral URI with cough    ED Discharge Orders    None       Lennice Sites, DO 11/03/17 0900

## 2017-11-03 NOTE — ED Triage Notes (Signed)
Pt c/o cough, sore throat,  fever, body aches x4 days

## 2017-11-03 NOTE — Discharge Instructions (Signed)
Continue to take 800 mg of Motrin 3 times a day as needed and 650 mg of Tylenol 4 times a day as needed for fever, body aches.  Increase oral hydration.

## 2018-03-07 ENCOUNTER — Emergency Department (HOSPITAL_BASED_OUTPATIENT_CLINIC_OR_DEPARTMENT_OTHER): Payer: Medicaid Other

## 2018-03-07 ENCOUNTER — Other Ambulatory Visit: Payer: Self-pay

## 2018-03-07 ENCOUNTER — Encounter (HOSPITAL_BASED_OUTPATIENT_CLINIC_OR_DEPARTMENT_OTHER): Payer: Self-pay | Admitting: Emergency Medicine

## 2018-03-07 ENCOUNTER — Emergency Department (HOSPITAL_BASED_OUTPATIENT_CLINIC_OR_DEPARTMENT_OTHER)
Admission: EM | Admit: 2018-03-07 | Discharge: 2018-03-07 | Disposition: A | Discharge status: Home / Self Care | Payer: Medicaid Other | Attending: Emergency Medicine | Admitting: Emergency Medicine

## 2018-03-07 DIAGNOSIS — R51 Headache: Secondary | ICD-10-CM | POA: Diagnosis present

## 2018-03-07 DIAGNOSIS — F17210 Nicotine dependence, cigarettes, uncomplicated: Secondary | ICD-10-CM | POA: Diagnosis not present

## 2018-03-07 DIAGNOSIS — R519 Headache, unspecified: Secondary | ICD-10-CM

## 2018-03-07 LAB — PREGNANCY, URINE: PREG TEST UR: NEGATIVE

## 2018-03-07 LAB — URINALYSIS, ROUTINE W REFLEX MICROSCOPIC
BILIRUBIN URINE: NEGATIVE
Glucose, UA: NEGATIVE mg/dL
HGB URINE DIPSTICK: NEGATIVE
Ketones, ur: 15 mg/dL — AB
Leukocytes, UA: NEGATIVE
NITRITE: NEGATIVE
PROTEIN: NEGATIVE mg/dL
pH: 5.5 (ref 5.0–8.0)

## 2018-03-07 MED ORDER — DEXAMETHASONE 6 MG PO TABS
10.0000 mg | ORAL_TABLET | Freq: Once | ORAL | Status: AC
  Administered 2018-03-07: 10 mg via ORAL
  Filled 2018-03-07: qty 1

## 2018-03-07 MED ORDER — PROCHLORPERAZINE EDISYLATE 10 MG/2ML IJ SOLN
10.0000 mg | Freq: Once | INTRAMUSCULAR | Status: AC
  Administered 2018-03-07: 10 mg via INTRAVENOUS

## 2018-03-07 MED ORDER — SODIUM CHLORIDE 0.9 % IV BOLUS
500.0000 mL | Freq: Once | INTRAVENOUS | Status: AC
  Administered 2018-03-07: 500 mL via INTRAVENOUS

## 2018-03-07 MED ORDER — DIPHENHYDRAMINE HCL 50 MG/ML IJ SOLN
25.0000 mg | Freq: Once | INTRAMUSCULAR | Status: DC
  Filled 2018-03-07: qty 1

## 2018-03-07 MED ORDER — PROCHLORPERAZINE EDISYLATE 10 MG/2ML IJ SOLN
10.0000 mg | Freq: Once | INTRAMUSCULAR | Status: DC
  Filled 2018-03-07: qty 2

## 2018-03-07 MED ORDER — DIPHENHYDRAMINE HCL 50 MG/ML IJ SOLN
25.0000 mg | Freq: Once | INTRAMUSCULAR | Status: AC
  Administered 2018-03-07: 25 mg via INTRAVENOUS

## 2018-03-07 NOTE — ED Notes (Signed)
Patient transported to CT 

## 2018-03-07 NOTE — ED Triage Notes (Signed)
Headache x 3 days with light sensitivity and nausea.

## 2018-03-07 NOTE — ED Provider Notes (Signed)
[facility] EMERGENCY DEPARTMENT Provider Note   CSN: [id number] Arrival date & time: 03/07/18  1546     History   Chief Complaint Chief Complaint  Patient presents with  . Headache    HPI Tracey French is a 37 y.o. female.  37 yo F with a chief complaint of headache.  This is been going on for the past 3 days.  No prior history of headache syndromes.  States it is worse with bright lights and loud noises.  Feels like a pile of bricks stacked on top of her head.  Worse when she sits up.  Has been waking her up in the morning.  Felt somewhat dizzy as well.  Felt the room was spinning.  Currently not dizzy.  She had some sharp chest pain that seems to come and go.  Not having chest pain currently.  The history is provided by the patient.  Headache   This is a new problem. The current episode started more than 2 days ago. The problem occurs constantly. The problem has been gradually worsening. The headache is associated with bright light. Pain location: top of the head. The quality of the pain is described as dull and throbbing. The pain is at a severity of 9/10. The pain is severe. The pain does not radiate. Associated symptoms include nausea and vomiting. Pertinent negatives include no fever, no palpitations and no shortness of breath. She has tried resting in a darkened room and NSAIDs for the symptoms. The treatment provided mild relief.    Past Medical History:  Diagnosis Date  . Fibroids   . Kidney stone   . Seizures ([place])     There are no active problems to display for this patient.   Past Surgical History:  Procedure Laterality Date  . KIDNEY STONE SURGERY    . TUBAL LIGATION       OB History   No obstetric history on file.      Home Medications    Prior to Admission medications   Not on File    Family History No family history on file.  Social History Social History   Tobacco Use  . Smoking status: Current Every Day Smoker    Packs/day:  0.50    Types: Cigarettes  . Smokeless tobacco: Never Used  Substance Use Topics  . Alcohol use: Yes    Comment: occ  . Drug use: No     Allergies   Codeine   Review of Systems Review of Systems  Constitutional: Negative for chills and fever.  HENT: Negative for congestion and rhinorrhea.   Eyes: Negative for redness and visual disturbance.  Respiratory: Negative for shortness of breath and wheezing.   Cardiovascular: Negative for chest pain and palpitations.  Gastrointestinal: Positive for nausea and vomiting.  Genitourinary: Negative for dysuria and urgency.  Musculoskeletal: Negative for arthralgias and myalgias.  Skin: Negative for pallor and wound.  Neurological: Positive for headaches. Negative for dizziness.     Physical Exam Updated Vital Signs BP (!) 109/57   Pulse (!) 50   Temp 98.6 F (37 C)   Resp 18   Ht 5\' 6"  (1.676 m)   Wt 64.4 kg   LMP 01/20/2018   SpO2 100%   BMI 22.92 kg/m   Physical Exam Vitals signs and nursing note reviewed.  Constitutional:      General: She is not in acute distress.    Appearance: She is well-developed. She is not diaphoretic.  HENT:  Head: Normocephalic and atraumatic.  Eyes:     Pupils: Pupils are equal, round, and reactive to light.  Neck:     Musculoskeletal: Normal range of motion and neck supple.  Cardiovascular:     Rate and Rhythm: Normal rate and regular rhythm.     Heart sounds: No murmur. No friction rub. No gallop.   Pulmonary:     Effort: Pulmonary effort is normal.     Breath sounds: No wheezing or rales.  Abdominal:     General: There is no distension.     Palpations: Abdomen is soft.     Tenderness: There is no abdominal tenderness.  Musculoskeletal:        General: No tenderness.  Skin:    General: Skin is warm and dry.  Neurological:     Mental Status: She is alert and oriented to person, place, and time.     GCS: GCS eye subscore is 4. GCS verbal subscore is 5. GCS motor subscore is 6.       Cranial Nerves: Cranial nerves are intact.     Sensory: Sensation is intact.     Motor: Motor function is intact.     Coordination: Coordination is intact.     Gait: Gait is intact.     Comments: Benign neuro exam  Psychiatric:        Behavior: Behavior normal.      ED Treatments / Results  Labs (all labs ordered are listed, but only abnormal results are displayed) Labs Reviewed  URINALYSIS, ROUTINE W REFLEX MICROSCOPIC - Abnormal; Notable for the following components:      Result Value   Specific Gravity, Urine >1.030 (*)    Ketones, ur 15 (*)    All other components within normal limits  PREGNANCY, URINE    EKG None  Radiology Ct Head Wo Contrast  Result Date: 03/07/2018 CLINICAL DATA:  Acute onset of headache. Photosensitivity and nausea. EXAM: CT HEAD WITHOUT CONTRAST TECHNIQUE: Contiguous axial images were obtained from the base of the skull through the vertex without intravenous contrast. COMPARISON:  None. FINDINGS: Brain: No evidence of acute infarction, hemorrhage, hydrocephalus, extra-axial collection or mass lesion/mass effect. The posterior fossa, including the cerebellum, brainstem and fourth ventricle, is within normal limits. The third and lateral ventricles, and basal ganglia are unremarkable in appearance. The cerebral hemispheres are symmetric in appearance, with normal gray-white differentiation. No mass effect or midline shift is seen. Vascular: No hyperdense vessel or unexpected calcification. Skull: There is no evidence of fracture; visualized osseous structures are unremarkable in appearance. Sinuses/Orbits: The orbits are within normal limits. The paranasal sinuses and mastoid air cells are well-aerated. Other: No significant soft tissue abnormalities are seen. IMPRESSION: Unremarkable noncontrast CT of the head. Electronically Signed   By: Garald Balding M.D.   On: 03/07/2018 21:22    Procedures Procedures (including critical care time)  Medications  Ordered in ED Medications  dexamethasone (DECADRON) tablet 10 mg (10 mg Oral Given 03/07/18 2135)  prochlorperazine (COMPAZINE) injection 10 mg (10 mg Intravenous Given 03/07/18 2134)  diphenhydrAMINE (BENADRYL) injection 25 mg (25 mg Intravenous Given 03/07/18 2135)  sodium chloride 0.9 % bolus 500 mL (0 mLs Intravenous Stopped 03/07/18 2222)     Initial Impression / Assessment and Plan / ED Course  I have reviewed the triage vital signs and the nursing notes.  Pertinent labs & imaging results that were available during my care of the patient were reviewed by me and considered in my medical decision  making (see chart for details).     37 yo F with a cc of a headache.  New for her.  Going on for past three days.  Wakes her up in the morning.  Will obtain CT. treat with headache cocktail and reassess.  Patient feeling much better on reassessment.  CT head negative for ICH.  D/c home.  Neuro follow up.   12:31 AM:  I have discussed the diagnosis/risks/treatment options with the patient and family and believe the pt to be eligible for discharge home to follow-up with PCP. We also discussed returning to the ED immediately if new or worsening sx occur. We discussed the sx which are most concerning (e.g., sudden worsening pain, fever, inability to tolerate by mouth) that necessitate immediate return. Medications administered to the patient during their visit and any new prescriptions provided to the patient are listed below.  Medications given during this visit Medications  dexamethasone (DECADRON) tablet 10 mg (10 mg Oral Given 03/07/18 2135)  prochlorperazine (COMPAZINE) injection 10 mg (10 mg Intravenous Given 03/07/18 2134)  diphenhydrAMINE (BENADRYL) injection 25 mg (25 mg Intravenous Given 03/07/18 2135)  sodium chloride 0.9 % bolus 500 mL (0 mLs Intravenous Stopped 03/07/18 2222)      The patient appears reasonably screen and/or stabilized for discharge and I doubt any other medical  condition or other [facility] requiring further screening, evaluation, or treatment in the ED at this time prior to discharge.      Final Clinical Impressions(s) / ED Diagnoses   Final diagnoses:  Bad headache    ED Discharge Orders         Ordered    Ambulatory referral to Neurology    Comments:  New headache syndrome   03/07/18 [place], [place], DO 03/08/18 0031

## 2018-03-07 NOTE — Discharge Instructions (Signed)
Only take medication if you have a headache.  Do not take a medicine to try and prevent a headache because it may make you have 1.  Take Tylenol or ibuprofen if you get a headache.  Please follow-up with the neurologist.  Return to the ED for worsening pain numbness or weakness to one side of the body or the other difficulty with speech or swallowing fever or neck pain.

## 2018-03-08 ENCOUNTER — Encounter: Payer: Self-pay | Admitting: Neurology

## 2018-03-16 ENCOUNTER — Ambulatory Visit: Payer: Self-pay | Admitting: Neurology

## 2018-03-19 ENCOUNTER — Encounter (HOSPITAL_BASED_OUTPATIENT_CLINIC_OR_DEPARTMENT_OTHER): Payer: Self-pay

## 2018-03-19 ENCOUNTER — Emergency Department (HOSPITAL_BASED_OUTPATIENT_CLINIC_OR_DEPARTMENT_OTHER)
Admission: EM | Admit: 2018-03-19 | Discharge: 2018-03-19 | Disposition: A | Discharge status: Home / Self Care | Payer: Medicaid Other | Attending: Emergency Medicine | Admitting: Emergency Medicine

## 2018-03-19 ENCOUNTER — Other Ambulatory Visit: Payer: Self-pay

## 2018-03-19 DIAGNOSIS — R52 Pain, unspecified: Secondary | ICD-10-CM

## 2018-03-19 DIAGNOSIS — N6452 Nipple discharge: Secondary | ICD-10-CM | POA: Diagnosis not present

## 2018-03-19 DIAGNOSIS — F17210 Nicotine dependence, cigarettes, uncomplicated: Secondary | ICD-10-CM | POA: Diagnosis not present

## 2018-03-19 DIAGNOSIS — N644 Mastodynia: Secondary | ICD-10-CM | POA: Diagnosis present

## 2018-03-19 LAB — PREGNANCY, URINE: PREG TEST UR: NEGATIVE

## 2018-03-19 MED ORDER — NAPROXEN 500 MG PO TABS: 500.0000 mg | ORAL_TABLET | Freq: Two times a day (BID) | ORAL | 0 refills | Status: DC

## 2018-03-19 NOTE — ED Notes (Signed)
PA at bedside for breast exam. Nurse present for exam.

## 2018-03-19 NOTE — Discharge Instructions (Addendum)
Evaluated today for breast pain.  These are most likely fibrocystic changes.  I referred you to a PCP.  Please follow-up for reevaluation.  I have given you naproxen.  Please take this as needed for pain.  Return to emergency department for any worsening symptoms.

## 2018-03-19 NOTE — ED Triage Notes (Signed)
Bilateral breast pain x 3 days. Pt reports she noticed some clear drainage from the nipple. Denies fever

## 2018-03-19 NOTE — ED Provider Notes (Signed)
[facility] EMERGENCY DEPARTMENT Provider Note   CSN: [id number] Arrival date & time: 03/19/18  1545  History   Chief Complaint Breast pain  HPI Tracey French is a 38 y.o. female with no significant past medical history who presents for evaluation of breast pain.  Patient states she has had pain to bilateral breast x3 days.  Patient states she thought she noticed clear nipple discharge 3 days ago as well.  Has not noticed any since.  Has not taken anything for symptoms.  Rates her pain a 4/10.  Pain does not radiate.  Pain is located to her entire bilateral breasts.  States pain is worse with palpation.  Denies any aggravating or alleviating factors.  Denies previous history of mammogram, history of breast malignancy, history of breast abscess, history of fibrocystic breast. LMP 03/05/18.  History obtained from patient.  No interpreter was used.  HPI  Past Medical History:  Diagnosis Date  . Fibroids   . Kidney stone   . Seizures ([place])     There are no active problems to display for this patient.   Past Surgical History:  Procedure Laterality Date  . KIDNEY STONE SURGERY    . TUBAL LIGATION       OB History   No obstetric history on file.      Home Medications    Prior to Admission medications   Medication Sig Start Date End Date Taking? Authorizing Provider  naproxen (NAPROSYN) 500 MG tablet Take 1 tablet (500 mg total) by mouth 2 (two) times daily. 03/19/18   Lathyn Griggs A, PA-C    Family History No family history on file.  Social History Social History   Tobacco Use  . Smoking status: Current Every Day Smoker    Packs/day: 0.50    Types: Cigarettes  . Smokeless tobacco: Never Used  Substance Use Topics  . Alcohol use: Yes    Comment: occ  . Drug use: No     Allergies   Codeine   Review of Systems Review of Systems  Constitutional: Negative.   HENT: Negative.   Respiratory: Negative.   Cardiovascular: Negative.     Gastrointestinal: Negative.   Genitourinary: Negative.        Breast pain  Musculoskeletal: Negative.   Skin: Negative.   Neurological: Negative.   All other systems reviewed and are negative.    Physical Exam Updated Vital Signs BP 103/71 (BP Location: Right Arm)   Pulse 68   Temp 98.4 F (36.9 C) (Oral)   Resp 16   Ht 5\' 6"  (1.676 m)   Wt 63.5 kg   LMP 03/11/2018   SpO2 98%   BMI 22.60 kg/m   Physical Exam Vitals signs and nursing note reviewed. Exam conducted with a chaperone present.  Constitutional:      General: She is not in acute distress.    Appearance: She is well-developed. She is not ill-appearing, toxic-appearing or diaphoretic.  HENT:     Head: Normocephalic and atraumatic.     Nose: Nose normal.     Mouth/Throat:     Mouth: Mucous membranes are moist.     Pharynx: Oropharynx is clear.  Eyes:     Pupils: Pupils are equal, round, and reactive to light.  Neck:     Musculoskeletal: Normal range of motion.  Cardiovascular:     Rate and Rhythm: Normal rate.     Pulses: Normal pulses.     Heart sounds: Normal heart sounds. No murmur. No  gallop.   Pulmonary:     Effort: No respiratory distress.     Comments: Clear to auscultation bilaterally without wheeze, rhonchi or rales. Chest:     Chest wall: No mass, lacerations, deformity, swelling, crepitus or edema. There is no dullness to percussion.     Breasts: Breasts are symmetrical.        Right: Tenderness present. No swelling, bleeding, inverted nipple, nipple discharge or skin change.        Left: No swelling, bleeding, inverted nipple, nipple discharge or skin change.     Comments: Bilateral breasts with multiple small, rubbery, tender fibrostic breast changes.  No swelling or overlying skin changes.  No inverted nipples.  No peu de orange skin changes. No solid mass. No asymmetry.Patient with bilateral nipple piercing's. Chaperone present during exam. Abdominal:     General: There is no distension.      Comments: Soft, nontender without rebound or guarding.  Musculoskeletal: Normal range of motion.     Comments: Moves all extremities without difficulty.  Lymphadenopathy:     Upper Body:     Right upper body: No supraclavicular, axillary or pectoral adenopathy.     Left upper body: No supraclavicular, axillary or pectoral adenopathy.  Skin:    General: Skin is warm and dry.     Comments: No edema, erythema, warmth to bilateral breasts.  No evidence of induration or fluctuance.  No overlying skin changes.  Neurological:     Mental Status: She is alert.      ED Treatments / Results  Labs (all labs ordered are listed, but only abnormal results are displayed) Labs Reviewed  PREGNANCY, URINE    EKG None  Radiology No results found.  Procedures Procedures (including critical care time)  Medications Ordered in ED Medications - No data to display   Initial Impression / Assessment and Plan / ED Course  I have reviewed the triage vital signs and the nursing notes.  Pertinent labs & imaging results that were available during my care of the patient were reviewed by me and considered in my medical decision making (see chart for details).  38 year old who appears otherwise well presents for evaluation of bilateral breast pain.  Afebrile, nonseptic, non-ill-appearing.  Pain onset 3 days ago.  Patient noted clear bilateral breast discharge, however there is none on evaluation.  Bilateral breast with multiple small, rubbery, tender fibrocystic breast changes.  No overlying skin changes. No solid mass to suggest malignancy. Patient with bilateral nipple piercing's, without evidence of current infection.  No evidence of skin changes to suggest infectious process.  Patient has exam consistent with fibrocystic breast changes.  No induration or fluctuation to breasts to suggest abscess or cellulitis.  Discussed follow-up with PCP for reevaluation with possible mammogram or breast ultrasound.  Low  suspicion for emergent pathology at this time.  Patient is hemodynamically stable and appropriate for DC home at this time.  I have discussed return precautions.  Patient voiced understanding and is agreeable for follow-up.     Final Clinical Impressions(s) / ED Diagnoses   Final diagnoses:  Acute breast pain    ED Discharge Orders         Ordered    naproxen (NAPROSYN) 500 MG tablet  2 times daily     03/19/18 1704           Krystyne Tewksbury A, PA-C 03/19/18 1807    Dorie Rank, MD 03/22/18 [phone]

## 2018-04-12 ENCOUNTER — Inpatient Hospital Stay: Payer: Medicaid Other | Admitting: Nurse Practitioner

## 2018-04-16 ENCOUNTER — Ambulatory Visit: Payer: Medicaid Other | Attending: Internal Medicine | Admitting: Internal Medicine

## 2018-04-16 ENCOUNTER — Encounter: Payer: Self-pay | Admitting: Internal Medicine

## 2018-04-16 VITALS — BP 107/73 | HR 51 | Temp 98.3°F | Wt 139.6 lb

## 2018-04-16 DIAGNOSIS — F17210 Nicotine dependence, cigarettes, uncomplicated: Secondary | ICD-10-CM | POA: Insufficient documentation

## 2018-04-16 DIAGNOSIS — N644 Mastodynia: Secondary | ICD-10-CM | POA: Diagnosis not present

## 2018-04-16 NOTE — Patient Instructions (Signed)
Please follow-up if you have any recurrent episodes of unexplained breast pain swelling or discharge from the nipple.   Fibrocystic Breast Changes  Fibrocystic breast changes are changes that can make your breasts swollen or painful. These changes happen when tiny sacs of fluid (cysts) form in the breast. This is a common condition. It does not mean that you have cancer. It usually happens because of hormone changes during a monthly period. Follow these instructions at home:  Check your breasts after every monthly period. If you do not have monthly periods, check your breasts on the first day of every month. Check for: ? Soreness. ? New swelling or puffiness. ? A change in breast size. ? A change in a lump that was already there.  Take over-the-counter and prescription medicines only as told by your doctor.  Wear a support or sports bra that fits well. Wear this support especially when you are exercising.  Avoid or have less caffeine, fat, and sugar in what you eat and drink as told by your doctor. Contact a doctor if:  You have fluid coming from your nipple, especially if the fluid has blood in it.  You have new lumps or bumps in your breast.  Your breast gets puffy, red, and painful.  You have changes in how your breast looks.  Your nipple looks flat or it sinks into your breast. Get help right away if:  Your breast turns red, and the redness is spreading. Summary  Fibrocystic breast changes are changes that can make your breasts swollen or painful.  This condition can happen when you have hormone changes during your monthly period.  With this condition, it is important to check your breasts after every monthly period. If you do not have monthly periods, check your breasts on the first day of every month. This information is not intended to replace advice given to you by your health care provider. Make sure you discuss any questions you have with your health care  provider. Document Released: 02/07/2008 Document Revised: 11/08/2015 Document Reviewed: 11/08/2015 Elsevier Interactive Patient Education  2019 [employer].

## 2018-04-16 NOTE — Progress Notes (Signed)
Patient ID: Tracey French, female    DOB: August 09, 1980  MRN: [id number]  CC: Hospitalization Follow-up   Subjective: Tracey French is a 38 y.o. female who presents for new pt visit and ER f/u. Her concerns today include:   No previous PCP   Pt seen in ER 03/19/2018 for acute BL breast pain, swelling and clear nipple dischg.  Symptoms had started 3 days prior to that ER visit.  Her menses went off about 2 days before symptoms started.  She did gives history of breast tenderness 1 to 2 weeks prior to menses but usually it is not as severe as it was when she had to be seen in the ER.  Patient was assessed to have probable fibrocystic breasts changes and was told to follow-up with PCP.  She reports that symptoms lasted for about 2 weeks and then resolved on their own.  She has had no reoccurrence.  No personal or family history of breast cancer.  She is never had a breast biopsy.  Last Pap smear was December 2019 at the health department in [facility].      Family history, social history and surgical histories reviewed. Current Outpatient Medications on File Prior to Visit  Medication Sig Dispense Refill  . naproxen (NAPROSYN) 500 MG tablet Take 1 tablet (500 mg total) by mouth 2 (two) times daily. (Patient not taking: Reported on 04/16/2018) 30 tablet 0   No current facility-administered medications on file prior to visit.     Allergies  Allergen Reactions  . Codeine Itching    Social History   Socioeconomic History  . Marital status: Legally Separated    Spouse name: Not on file  . Number of children: 3  . Years of education: Not on file  . Highest education level: Not on file  Occupational History  . Not on file  Social Needs  . Financial resource strain: Not on file  . Food insecurity:    Worry: Not on file    Inability: Not on file  . Transportation needs:    Medical: Not on file    Non-medical: Not on file  Tobacco Use  . Smoking status: Current Every Day Smoker   Packs/day: 0.50    Types: Cigarettes  . Smokeless tobacco: Never Used  Substance and Sexual Activity  . Alcohol use: Yes    Comment: occ  . Drug use: No  . Sexual activity: Yes    Birth control/protection: Surgical  Lifestyle  . Physical activity:    Days per week: Not on file    Minutes per session: Not on file  . Stress: Not on file  Relationships  . Social connections:    Talks on phone: Not on file    Gets together: Not on file    Attends religious service: Not on file    Active member of club or organization: Not on file    Attends meetings of clubs or organizations: Not on file    Relationship status: Not on file  . Intimate partner violence:    Fear of current or ex partner: Not on file    Emotionally abused: Not on file    Physically abused: Not on file    Forced sexual activity: Not on file  Other Topics Concern  . Not on file  Social History Narrative  . Not on file    Family History  Problem Relation Age of Onset  . Hypertension Mother   . Hypertension Father   .  Diabetes Father   . Diabetes Maternal Grandmother   . Diabetes Paternal Grandmother     Past Surgical History:  Procedure Laterality Date  . INGUINAL HERNIA REPAIR    . KIDNEY STONE SURGERY    . TUBAL LIGATION      ROS: Review of Systems Negative except as above.   PHYSICAL EXAM: BP 107/73   Pulse (!) 51   Temp 98.3 F (36.8 C) (Oral)   Wt 139 lb 9.6 oz (63.3 kg)   SpO2 96%   BMI 22.53 kg/m   Physical Exam  General appearance - alert, well appearing, young African-American female and in no distress Mental status - normal mood, behavior, speech, dress, motor activity, and thought processes Breasts -I was accompanied by chaperone She[facility] General:  breasts appear normal, no suspicious masses, no skin or nipple changes or axillary nodes.  She has piercings through both nipples   ASSESSMENT AND PLAN:  1. Mastalgia in female Given that symptoms have resolved and exam at this time is  benign, I advised patient to follow-up if she has any further episodes.  I do not think we need to image at this time  Patient was given the opportunity to ask questions.  Patient verbalized understanding of the plan and was able to repeat key elements of the plan.   No orders of the defined types were placed in this encounter.    Requested Prescriptions    No prescriptions requested or ordered in this encounter    No follow-ups on file.  Karle Plumber, MD, FACP

## 2018-05-26 ENCOUNTER — Encounter (HOSPITAL_BASED_OUTPATIENT_CLINIC_OR_DEPARTMENT_OTHER): Payer: Self-pay

## 2018-05-26 ENCOUNTER — Emergency Department (HOSPITAL_BASED_OUTPATIENT_CLINIC_OR_DEPARTMENT_OTHER)
Admission: EM | Admit: 2018-05-26 | Discharge: 2018-05-26 | Disposition: A | Discharge status: Home / Self Care | Payer: Medicaid Other | Attending: Emergency Medicine | Admitting: Emergency Medicine

## 2018-05-26 ENCOUNTER — Other Ambulatory Visit: Payer: Self-pay

## 2018-05-26 DIAGNOSIS — F17210 Nicotine dependence, cigarettes, uncomplicated: Secondary | ICD-10-CM | POA: Insufficient documentation

## 2018-05-26 DIAGNOSIS — R300 Dysuria: Secondary | ICD-10-CM | POA: Diagnosis present

## 2018-05-26 DIAGNOSIS — Z791 Long term (current) use of non-steroidal anti-inflammatories (NSAID): Secondary | ICD-10-CM | POA: Diagnosis not present

## 2018-05-26 DIAGNOSIS — B9689 Other specified bacterial agents as the cause of diseases classified elsewhere: Secondary | ICD-10-CM

## 2018-05-26 DIAGNOSIS — A599 Trichomoniasis, unspecified: Secondary | ICD-10-CM | POA: Diagnosis not present

## 2018-05-26 DIAGNOSIS — N760 Acute vaginitis: Secondary | ICD-10-CM | POA: Insufficient documentation

## 2018-05-26 LAB — URINALYSIS, ROUTINE W REFLEX MICROSCOPIC
BILIRUBIN URINE: NEGATIVE
Glucose, UA: NEGATIVE mg/dL
KETONES UR: NEGATIVE mg/dL
Nitrite: NEGATIVE
PH: 6 (ref 5.0–8.0)
Protein, ur: NEGATIVE mg/dL
Specific Gravity, Urine: 1.02 (ref 1.005–1.030)

## 2018-05-26 LAB — URINALYSIS, MICROSCOPIC (REFLEX)

## 2018-05-26 LAB — PREGNANCY, URINE: Preg Test, Ur: NEGATIVE

## 2018-05-26 LAB — WET PREP, GENITAL
SPERM: NONE SEEN
Yeast Wet Prep HPF POC: NONE SEEN

## 2018-05-26 MED ORDER — METOCLOPRAMIDE HCL 10 MG PO TABS
5.0000 mg | ORAL_TABLET | Freq: Once | ORAL | Status: AC
  Administered 2018-05-26: 5 mg via ORAL
  Filled 2018-05-26: qty 1

## 2018-05-26 MED ORDER — METRONIDAZOLE 500 MG PO TABS
2000.0000 mg | ORAL_TABLET | Freq: Once | ORAL | Status: AC
  Administered 2018-05-26: 2000 mg via ORAL
  Filled 2018-05-26: qty 4

## 2018-05-26 MED ORDER — KETOROLAC TROMETHAMINE 15 MG/ML IJ SOLN
15.0000 mg | Freq: Once | INTRAMUSCULAR | Status: AC
  Administered 2018-05-26: 15 mg via INTRAMUSCULAR
  Filled 2018-05-26: qty 1

## 2018-05-26 MED ORDER — METRONIDAZOLE 500 MG PO TABS: 500.0000 mg | ORAL_TABLET | Freq: Two times a day (BID) | ORAL | 0 refills | Status: DC

## 2018-05-26 NOTE — ED Triage Notes (Signed)
Pt states beginning Monday afternoon, having increased fatigue, urinary frequency and urgency and low abdominal pain.  Denies any visible blood in urine

## 2018-05-26 NOTE — ED Provider Notes (Signed)
[facility] EMERGENCY DEPARTMENT Provider Note   CSN: [id number] Arrival date & time: 05/26/18  1038    History   Chief Complaint Chief Complaint  Patient presents with  . Urinary Frequency  . Abdominal Pain  . Fatigue    HPI Tracey French is a 38 y.o. female presenting for evaluation of urinary frequency, suprapubic discomfort, and tiredness.  Patient states the past 3 days, she has been having symptoms.  She reports generalized tiredness.  Urinary frequency and dysuria without hematuria.  She has mild suprapubic discomfort.  Patient states she has not had a bowel movement in several days.  She denies fevers, chills, sore throat, cough, chest pain, nausea, vomiting, upper abdominal pain, flank pain.  Patient states this feels similar to previous time she has been seen for the same.  She reports clear vaginal discharge 2 days ago, none today.  She is sexually active with one female partner who is symptom-free.  Patient states she has had her tubes tied.  Last period was 2 weeks ago, was lighter than normal.  She does not use condoms or additional contraception.     HPI  Past Medical History:  Diagnosis Date  . Fibroids   . Kidney stone   . Seizures [facility])     Patient Active Problem List   Diagnosis Date Noted  . Mastalgia in female 04/16/2018    Past Surgical History:  Procedure Laterality Date  . INGUINAL HERNIA REPAIR    . KIDNEY STONE SURGERY    . TUBAL LIGATION       OB History   No obstetric history on file.      Home Medications    Prior to Admission medications   Medication Sig Start Date End Date Taking? Authorizing Provider  naproxen (NAPROSYN) 500 MG tablet Take 1 tablet (500 mg total) by mouth 2 (two) times daily. 03/19/18  Yes Henderly, Britni A, PA-C  metroNIDAZOLE (FLAGYL) 500 MG tablet Take 1 tablet (500 mg total) by mouth 2 (two) times daily. 05/26/18   Dartanian Knaggs, PA-C    Family History Family History  Problem Relation Age of  Onset  . Hypertension Mother   . Hypertension Father   . Diabetes Father   . Diabetes Maternal Grandmother   . Diabetes Paternal Grandmother     Social History Social History   Tobacco Use  . Smoking status: Current Every Day Smoker    Packs/day: 0.50    Types: Cigarettes  . Smokeless tobacco: Never Used  Substance Use Topics  . Alcohol use: Yes    Comment: occ  . Drug use: No     Allergies   Codeine   Review of Systems Review of Systems  Constitutional: Negative for fever.  Gastrointestinal: Positive for abdominal pain and constipation.  Genitourinary: Positive for dysuria, frequency and vaginal discharge.  All other systems reviewed and are negative.    Physical Exam Updated Vital Signs BP 115/70 (BP Location: Right Arm)   Pulse 88   Temp 98.2 F (36.8 C) (Oral)   Resp 18   Ht 5\' 6"  (1.676 m)   Wt 64.4 kg   SpO2 100%   BMI 22.92 kg/m   Physical Exam Vitals signs and nursing note reviewed. Exam conducted with a chaperone present.  Constitutional:      General: She is not in acute distress.    Appearance: She is well-developed.     Comments: Appears nontoxic  HENT:     Head: Normocephalic and atraumatic.  Eyes:     Conjunctiva/sclera: Conjunctivae normal.     Pupils: Pupils are equal, round, and reactive to light.  Neck:     Musculoskeletal: Normal range of motion and neck supple.  Cardiovascular:     Rate and Rhythm: Normal rate and regular rhythm.  Pulmonary:     Effort: Pulmonary effort is normal. No respiratory distress.     Breath sounds: Normal breath sounds. No wheezing.  Abdominal:     General: There is no distension.     Palpations: Abdomen is soft. There is no mass.     Tenderness: There is no guarding or rebound.     Comments: Minimal to no tenderness palpation of suprapubic abdomen.  Abdomen is soft without rigidity, guarding, distention.  No pain at McBurney's point.  Negative rebound.  No CVA tenderness  Genitourinary:    Exam  position: Supine.     Vagina: Normal.     Cervix: No cervical motion tenderness or friability.     Uterus: Normal. Not tender.      Adnexa: Right adnexa normal and left adnexa normal.     Comments: No CMT or adnexal tenderness.  Thick white discharge noted on exam. Musculoskeletal: Normal range of motion.  Skin:    General: Skin is warm and dry.     Capillary Refill: Capillary refill takes less than 2 seconds.  Neurological:     Mental Status: She is alert and oriented to person, place, and time.      ED Treatments / Results  Labs (all labs ordered are listed, but only abnormal results are displayed) Labs Reviewed  WET PREP, GENITAL - Abnormal; Notable for the following components:      Result Value   Trich, Wet Prep PRESENT (*)    Clue Cells Wet Prep HPF POC PRESENT (*)    WBC, Wet Prep HPF POC MANY (*)    All other components within normal limits  URINALYSIS, ROUTINE W REFLEX MICROSCOPIC - Abnormal; Notable for the following components:   APPearance CLOUDY (*)    Hgb urine dipstick TRACE (*)    Leukocytes,Ua SMALL (*)    All other components within normal limits  URINALYSIS, MICROSCOPIC (REFLEX) - Abnormal; Notable for the following components:   Bacteria, UA MANY (*)    Trichomonas, UA PRESENT (*)    All other components within normal limits  PREGNANCY, URINE  GC/CHLAMYDIA PROBE AMP ([place]) NOT AT [facility]    EKG None  Radiology No results found.  Procedures Procedures (including critical care time)  Medications Ordered in ED Medications  ketorolac (TORADOL) 15 MG/ML injection 15 mg (15 mg Intramuscular Given 05/26/18 1121)  metoCLOPramide (REGLAN) tablet 5 mg (5 mg Oral Given 05/26/18 1120)  metroNIDAZOLE (FLAGYL) tablet 2,000 mg (2,000 mg Oral Given 05/26/18 1302)     Initial Impression / Assessment and Plan / ED Course  I have reviewed the triage vital signs and the nursing notes.  Pertinent labs & imaging results that were available during my care of  the patient were reviewed by me and considered in my medical decision making (see chart for details).        Patient presenting for evaluation of suprapubic discomfort, urinary symptoms, and tiredness.  On exam, patient without significant abdominal tenderness.  No fevers, nausea, vomiting.  Low suspicion for GI infection, perforation, obstruction, or surgical abdomen.  Fever GU cause, especially considering patient's frequent history of BV and trichomoniasis.  On pelvic exam, patient with vaginal discharge, but no  CMT or adnexal tenderness.  Will send wet prep, urine, and reassess.  Wet prep positive for clue cells and trichomoniasis.  Urine also positive for trichomoniasis.  Urine equivocal for UTI, and results may also be due to trichomoniasis.  Favor this over UTI at this time.  Gonorrhea and chlamydia sent.  Discussed findings with patient.  Discussed importance of treatment, and importance that her partner also be treated for trichomoniasis.  Discussed abstinence to prevent reinfection, and follow-up with OB/GYN if symptoms persist.  At this time, patient appears safe discharge.  Return precautions given.  Patient states she understands and agrees to plan.  Final Clinical Impressions(s) / ED Diagnoses   Final diagnoses:  BV (bacterial vaginosis)  Trichimoniasis    ED Discharge Orders         Ordered    metroNIDAZOLE (FLAGYL) 500 MG tablet  2 times daily     05/26/18 1248           Estes Lehner, PA-C 05/26/18 1610    Gareth Morgan, MD 05/26/18 2148

## 2018-05-26 NOTE — Discharge Instructions (Signed)
Take Flagyl as prescribed.  Take the entire course, even if your symptoms improve. Do not drink alcohol while taking this medicine. Use Tylenol and ibuprofen as needed for pain.  You may also use a heating pad to help with pain. Make sure your partner gets treated for trichomoniasis. Abstain from sex for 2 weeks to prevent reinfection. Results for gonorrhea and chlamydia are pending.  If positive, you will receive a phone call and will need treatment.  If negative, you will not receive a phone call.  Either way may check online on MyChart. Follow-up with OB/GYN if your symptoms are not improving or with continued infections. Return to the emergency room with any new, worsening, concerning symptoms.

## 2018-05-27 LAB — GC/CHLAMYDIA PROBE AMP (~~LOC~~) NOT AT ARMC
CHLAMYDIA, DNA PROBE: NEGATIVE
NEISSERIA GONORRHEA: NEGATIVE

## 2019-01-19 ENCOUNTER — Emergency Department (HOSPITAL_BASED_OUTPATIENT_CLINIC_OR_DEPARTMENT_OTHER)
Admission: EM | Admit: 2019-01-19 | Discharge: 2019-01-19 | Disposition: A | Discharge status: Home / Self Care | Payer: Medicaid Other | Attending: Emergency Medicine | Admitting: Emergency Medicine

## 2019-01-19 ENCOUNTER — Emergency Department (HOSPITAL_BASED_OUTPATIENT_CLINIC_OR_DEPARTMENT_OTHER): Payer: Medicaid Other

## 2019-01-19 ENCOUNTER — Other Ambulatory Visit: Payer: Self-pay

## 2019-01-19 ENCOUNTER — Encounter (HOSPITAL_BASED_OUTPATIENT_CLINIC_OR_DEPARTMENT_OTHER): Payer: Self-pay | Admitting: Oncology

## 2019-01-19 DIAGNOSIS — Z885 Allergy status to narcotic agent status: Secondary | ICD-10-CM | POA: Insufficient documentation

## 2019-01-19 DIAGNOSIS — B9689 Other specified bacterial agents as the cause of diseases classified elsewhere: Secondary | ICD-10-CM

## 2019-01-19 DIAGNOSIS — R1031 Right lower quadrant pain: Secondary | ICD-10-CM | POA: Diagnosis not present

## 2019-01-19 DIAGNOSIS — N939 Abnormal uterine and vaginal bleeding, unspecified: Secondary | ICD-10-CM

## 2019-01-19 DIAGNOSIS — N760 Acute vaginitis: Secondary | ICD-10-CM | POA: Insufficient documentation

## 2019-01-19 DIAGNOSIS — R109 Unspecified abdominal pain: Secondary | ICD-10-CM

## 2019-01-19 DIAGNOSIS — F17210 Nicotine dependence, cigarettes, uncomplicated: Secondary | ICD-10-CM | POA: Insufficient documentation

## 2019-01-19 LAB — URINALYSIS, MICROSCOPIC (REFLEX)

## 2019-01-19 LAB — WET PREP, GENITAL
Sperm: NONE SEEN
Trich, Wet Prep: NONE SEEN
Yeast Wet Prep HPF POC: NONE SEEN

## 2019-01-19 LAB — URINALYSIS, ROUTINE W REFLEX MICROSCOPIC
Bilirubin Urine: NEGATIVE
Glucose, UA: NEGATIVE mg/dL
Ketones, ur: NEGATIVE mg/dL
Leukocytes,Ua: NEGATIVE
Nitrite: NEGATIVE
Protein, ur: NEGATIVE mg/dL
Specific Gravity, Urine: 1.025 (ref 1.005–1.030)
pH: 6 (ref 5.0–8.0)

## 2019-01-19 LAB — PREGNANCY, URINE: Preg Test, Ur: NEGATIVE

## 2019-01-19 MED ORDER — IBUPROFEN 800 MG PO TABS: 800.0000 mg | ORAL_TABLET | Freq: Three times a day (TID) | ORAL | 0 refills | Status: AC | PRN

## 2019-01-19 MED ORDER — ONDANSETRON HCL 4 MG/2ML IJ SOLN
4.0000 mg | Freq: Once | INTRAMUSCULAR | Status: AC
  Administered 2019-01-19: 07:00:00 4 mg via INTRAVENOUS
  Filled 2019-01-19: qty 2

## 2019-01-19 MED ORDER — FENTANYL CITRATE (PF) 100 MCG/2ML IJ SOLN
100.0000 ug | Freq: Once | INTRAMUSCULAR | Status: AC
  Administered 2019-01-19: 100 ug via INTRAVENOUS
  Filled 2019-01-19: qty 2

## 2019-01-19 MED ORDER — METRONIDAZOLE 500 MG PO TABS: 500.0000 mg | ORAL_TABLET | Freq: Two times a day (BID) | ORAL | 0 refills | Status: DC

## 2019-01-19 MED ORDER — METHOCARBAMOL 750 MG PO TABS: 750.0000 mg | ORAL_TABLET | Freq: Four times a day (QID) | ORAL | 0 refills | Status: AC | PRN

## 2019-01-19 NOTE — ED Notes (Signed)
Patient transported to CT 

## 2019-01-19 NOTE — ED Notes (Signed)
ED Provider at bedside. 

## 2019-01-19 NOTE — ED Triage Notes (Signed)
Pt reports her menstrual cycle ended a week ago and she has been spotting since that time. Upon waking this morning she noticed vaginal bleeding was comparable to normal menstrual bleeding. Pt also reports right sided back pain 8/10.

## 2019-01-19 NOTE — Discharge Instructions (Addendum)
You were seen in the emergency department for right flank pain for 2 weeks and abnormal vaginal bleeding.  Your pregnancy test was negative.  You had a CAT scan that did not show any obvious kidney stones.  You were tested for gonorrhea chlamydia and these tests are pending at time of discharge.  You were positive for BV and we are prescribing you antibiotics for that. We are also prescribing you ibuprofen and a medication for muscle spasms.  Follow-up with your doctor or return if any worsening symptoms.

## 2019-01-19 NOTE — ED Provider Notes (Signed)
[facility] DEPT Provider Note: Georgena Spurling, MD, FACEP  CSN: [id number] MRN: [id number] ARRIVAL: 01/19/19 at [place]  Flank Pain and Vaginal Bleeding   HISTORY OF PRESENT ILLNESS  01/19/19 6:18 AM Tracey French is a 38 y.o. female with a history of kidney stones and multiple visits for STDs.  She is here with a right flank pain that has been present for about 2 weeks.  She rates the pain as an 8 out of 10.  It worsened this morning which is why she came in.  She thinks it may be similar to previous kidney stone pain.  She is equivocal about whether it is worse with movement.  She is also here with vaginal bleeding.  Her usual menstrual cycle ended about a week ago but she continued spotting.  At this morning she noticed vaginal bleeding that was as heavy as a regular menstrual period and is associated with pelvic pain which she describes as feeling like menstrual cramps.  She is not having vomiting or diarrhea.  She is not having a fever.  She is also having pain in her left popliteal fossa.   Past Medical History:  Diagnosis Date  . Fibroids   . Kidney stone   . Seizures ([place])     Past Surgical History:  Procedure Laterality Date  . INGUINAL HERNIA REPAIR    . KIDNEY STONE SURGERY    . TUBAL LIGATION      Family History  Problem Relation Age of Onset  . Hypertension Mother   . Hypertension Father   . Diabetes Father   . Diabetes Maternal Grandmother   . Diabetes Paternal Grandmother     Social History   Tobacco Use  . Smoking status: Current Every Day Smoker    Packs/day: 0.50    Types: Cigarettes  . Smokeless tobacco: Never Used  Substance Use Topics  . Alcohol use: Yes    Comment: occ  . Drug use: No    Prior to Admission medications   Medication Sig Start Date End Date Taking? Authorizing Provider  ibuprofen (ADVIL) 800 MG tablet Take 1 tablet (800 mg total) by mouth every 8 (eight) hours as needed. 01/19/19   Hayden Rasmussen, MD  methocarbamol (ROBAXIN) 750 MG tablet Take 1 tablet (750 mg total) by mouth every 6 (six) hours as needed for muscle spasms. 01/19/19   Hayden Rasmussen, MD  metroNIDAZOLE (FLAGYL) 500 MG tablet Take 1 tablet (500 mg total) by mouth 2 (two) times daily. 01/19/19   Hayden Rasmussen, MD    Allergies Codeine   REVIEW OF SYSTEMS  Negative except as noted here or in the History of Present Illness.   PHYSICAL EXAMINATION  Initial Vital Signs Blood pressure 122/81, pulse 65, temperature 98.1 F (36.7 C), temperature source Oral, resp. rate 16, height 5\' 6"  (1.676 m), weight 61.7 kg, last menstrual period 01/11/2019, SpO2 100 %.  Examination General: Well-developed, well-nourished female in no acute distress; appearance consistent with age of record HENT: normocephalic; atraumatic Eyes: pupils equal, round and reactive to light; extraocular muscles intact Neck: supple Heart: regular rate and rhythm Lungs: clear to auscultation bilaterally Abdomen: soft; nondistended; mild suprapubic tenderness; no masses or hepatosplenomegaly; bowel sounds present GU: Mild right CVA tenderness; normal external genitalia; vaginal bleeding; no vaginal discharge; mild cervical motion tenderness; no cervical erythema Extremities: No deformity; full range of motion; pulses normal; mild left popliteal tenderness Neurologic: Awake, alert and oriented; motor  function intact in all extremities and symmetric; no facial droop Skin: Warm and dry Psychiatric: Normal mood and affect   RESULTS  Summary of this visit's results, reviewed and interpreted by myself:   EKG Interpretation  Date/Time:    Ventricular Rate:    PR Interval:    QRS Duration:   QT Interval:    QTC Calculation:   R Axis:     Text Interpretation:        Laboratory Studies: Results for orders placed or performed during the hospital encounter of 01/19/19 (from the past 24 hour(s))  Pregnancy, urine     Status: None    Collection Time: 01/19/19  6:19 AM  Result Value Ref Range   Preg Test, Ur NEGATIVE NEGATIVE  Urinalysis, Routine w reflex microscopic     Status: Abnormal   Collection Time: 01/19/19  6:19 AM  Result Value Ref Range   Color, Urine YELLOW YELLOW   APPearance CLOUDY (A) CLEAR   Specific Gravity, Urine 1.025 1.005 - 1.030   pH 6.0 5.0 - 8.0   Glucose, UA NEGATIVE NEGATIVE mg/dL   Hgb urine dipstick LARGE (A) NEGATIVE   Bilirubin Urine NEGATIVE NEGATIVE   Ketones, ur NEGATIVE NEGATIVE mg/dL   Protein, ur NEGATIVE NEGATIVE mg/dL   Nitrite NEGATIVE NEGATIVE   Leukocytes,Ua NEGATIVE NEGATIVE  Urinalysis, Microscopic (reflex)     Status: Abnormal   Collection Time: 01/19/19  6:19 AM  Result Value Ref Range   RBC / HPF 0-5 0 - 5 RBC/hpf   WBC, UA 0-5 0 - 5 WBC/hpf   Bacteria, UA MANY (A) NONE SEEN   Squamous Epithelial / LPF 11-20 0 - 5  Wet prep, genital     Status: Abnormal   Collection Time: 01/19/19  6:33 AM   Specimen: Cervix  Result Value Ref Range   Yeast Wet Prep HPF POC NONE SEEN NONE SEEN   Trich, Wet Prep NONE SEEN NONE SEEN   Clue Cells Wet Prep HPF POC PRESENT (A) NONE SEEN   WBC, Wet Prep HPF POC FEW (A) NONE SEEN   Sperm NONE SEEN    Imaging Studies: Ct Renal Stone Study  Result Date: 01/19/2019 CLINICAL DATA:  Right flank pain 1 week. Hematuria. Suspect stone disease. EXAM: CT ABDOMEN AND PELVIS WITHOUT CONTRAST TECHNIQUE: Multidetector CT imaging of the abdomen and pelvis was performed following the standard protocol without IV contrast. COMPARISON:  01/14/2015 FINDINGS: Lower chest: Lung bases are clear. Hepatobiliary: Liver, gallbladder and biliary tree are normal. Pancreas: Normal. Spleen: Normal. Adrenals/Urinary Tract: Adrenal glands are normal. Kidneys are normal in size without hydronephrosis or nephrolithiasis. No perinephric inflammation or fluid. Ureters and bladder are normal. Stomach/Bowel: Stomach and small bowel are within normal. Appendix is normal. Colon  is unremarkable. Vascular/Lymphatic: Normal. Reproductive: Normal. Other: No free fluid or focal inflammatory change. Several stable pelvic phleboliths. Musculoskeletal: Unremarkable. IMPRESSION: No acute findings in the abdomen/pelvis. No evidence of renal/ureteral stones or obstruction. Electronically Signed   By: Marin Olp M.D.   On: 01/19/2019 07:33    ED COURSE and MDM  Nursing notes, initial and subsequent vitals signs, including pulse oximetry, reviewed and interpreted by myself.  Vitals:   01/19/19 0617 01/19/19 0735 01/19/19 0745 01/19/19 0757  BP:  125/76 118/86   Pulse:  (!) 46 (!) 50   Resp:  16 16   Temp:    97.6 F (36.4 C)  TempSrc:    Oral  SpO2:  99% 96%   Weight: 61.7 kg  Height: 5\' 6"  (1.676 m)      Medications  ondansetron (ZOFRAN) injection 4 mg (4 mg Intravenous Given 01/19/19 0728)  fentaNYL (SUBLIMAZE) injection 100 mcg (100 mcg Intravenous Given 01/19/19 0731)   7:00 AM Signed out to Dr. Melina Copa. CT Renal Study pending.   PROCEDURES  Procedures   ED DIAGNOSES     ICD-10-CM   1. Vaginal bleeding  N93.9   2. Right flank pain  R10.9   3. BV (bacterial vaginosis)  N76.0    B96.89        Bryar Rennie, Jenny Reichmann, MD 01/19/19 2239

## 2019-01-19 NOTE — ED Provider Notes (Signed)
Signout from Dr. Florina Ou.  38 year old female with history of kidney stones and STDs here with right-sided back pain and new onset of vaginal bleeding outside of her regular cycle.  Pelvic exam has been done was not consistent with PID.  Plan is to follow-up on CT imaging.  Disposition per results of imaging. Physical Exam  BP 122/81 (BP Location: Right Arm)   Pulse 65   Temp 98.1 F (36.7 C) (Oral)   Resp 16   Ht 5\' 6"  (1.676 m)   Wt 61.7 kg   LMP 01/11/2019 (Approximate) Comment: (-) urine pregnancy//ac  SpO2 100%   BMI 21.95 kg/m   Physical Exam  ED Course/Procedures     Procedures  MDM  Patient CT did not show any acute findings.  Her wet prep showed clue cells.  GC chlamydia pending.  I reviewed this with the patient.  She does not feel this is likely to be gonorrhea or chlamydia and will hold off on antibiotics for that until the tests are resulted.  She is agreeable to current plan of treatment with Flagyl for BV and ibuprofen Robaxin for musculoskeletal pain.  Return instructions given.       Hayden Rasmussen, MD 01/19/19 [phone]

## 2019-01-20 LAB — GC/CHLAMYDIA PROBE AMP (~~LOC~~) NOT AT ARMC
Chlamydia: NEGATIVE
Neisseria Gonorrhea: NEGATIVE

## 2019-07-05 ENCOUNTER — Emergency Department (HOSPITAL_BASED_OUTPATIENT_CLINIC_OR_DEPARTMENT_OTHER): Payer: Medicaid Other

## 2019-07-05 ENCOUNTER — Encounter (HOSPITAL_BASED_OUTPATIENT_CLINIC_OR_DEPARTMENT_OTHER): Payer: Self-pay

## 2019-07-05 ENCOUNTER — Other Ambulatory Visit: Payer: Self-pay

## 2019-07-05 ENCOUNTER — Emergency Department (HOSPITAL_BASED_OUTPATIENT_CLINIC_OR_DEPARTMENT_OTHER)
Admission: EM | Admit: 2019-07-05 | Discharge: 2019-07-05 | Disposition: A | Discharge status: Home / Self Care | Payer: Medicaid Other | Attending: Emergency Medicine | Admitting: Emergency Medicine

## 2019-07-05 DIAGNOSIS — R109 Unspecified abdominal pain: Secondary | ICD-10-CM | POA: Diagnosis present

## 2019-07-05 DIAGNOSIS — R102 Pelvic and perineal pain: Secondary | ICD-10-CM | POA: Diagnosis not present

## 2019-07-05 DIAGNOSIS — F17210 Nicotine dependence, cigarettes, uncomplicated: Secondary | ICD-10-CM | POA: Insufficient documentation

## 2019-07-05 DIAGNOSIS — Z79899 Other long term (current) drug therapy: Secondary | ICD-10-CM | POA: Diagnosis not present

## 2019-07-05 DIAGNOSIS — A5901 Trichomonal vulvovaginitis: Secondary | ICD-10-CM | POA: Insufficient documentation

## 2019-07-05 LAB — COMPREHENSIVE METABOLIC PANEL
ALT: 25 U/L (ref 0–44)
AST: 29 U/L (ref 15–41)
Albumin: 3.6 g/dL (ref 3.5–5.0)
Alkaline Phosphatase: 48 U/L (ref 38–126)
Anion gap: 8 (ref 5–15)
BUN: 14 mg/dL (ref 6–20)
CO2: 26 mmol/L (ref 22–32)
Calcium: 8.5 mg/dL — ABNORMAL LOW (ref 8.9–10.3)
Chloride: 102 mmol/L (ref 98–111)
Creatinine, Ser: 0.86 mg/dL (ref 0.44–1.00)
GFR calc Af Amer: >60 mL/min (ref >60)
GFR calc non Af Amer: >60 mL/min (ref >60)
Glucose, Bld: 100 mg/dL — ABNORMAL HIGH (ref 70–99)
Potassium: 3.8 mmol/L (ref 3.5–5.1)
Sodium: 136 mmol/L (ref 135–145)
Total Bilirubin: 0.8 mg/dL (ref 0.3–1.2)
Total Protein: 6.9 g/dL (ref 6.5–8.1)

## 2019-07-05 LAB — CBC WITH DIFFERENTIAL/PLATELET
Abs Immature Granulocytes: 0.02 10*3/uL (ref 0.00–0.07)
Basophils Absolute: 0 10*3/uL (ref 0.0–0.1)
Basophils Relative: 0 %
Eosinophils Absolute: 0.1 10*3/uL (ref 0.0–0.5)
Eosinophils Relative: 1 %
HCT: 41 % (ref 36.0–46.0)
Hemoglobin: 14 g/dL (ref 12.0–15.0)
Immature Granulocytes: 0 %
Lymphocytes Relative: 24 %
Lymphs Abs: 1.9 10*3/uL (ref 0.7–4.0)
MCH: 31.2 pg (ref 26.0–34.0)
MCHC: 34.1 g/dL (ref 30.0–36.0)
MCV: 91.3 fL (ref 80.0–100.0)
Monocytes Absolute: 0.8 10*3/uL (ref 0.1–1.0)
Monocytes Relative: 10 %
Neutro Abs: 5 10*3/uL (ref 1.7–7.7)
Neutrophils Relative %: 65 %
Platelets: 194 10*3/uL (ref 150–400)
RBC: 4.49 MIL/uL (ref 3.87–5.11)
RDW: 13.3 % (ref 11.5–15.5)
WBC: 7.9 10*3/uL (ref 4.0–10.5)
nRBC: 0 % (ref 0.0–0.2)

## 2019-07-05 LAB — URINALYSIS, ROUTINE W REFLEX MICROSCOPIC
Bilirubin Urine: NEGATIVE
Glucose, UA: NEGATIVE mg/dL
Hgb urine dipstick: NEGATIVE
Ketones, ur: NEGATIVE mg/dL
Leukocytes,Ua: NEGATIVE
Nitrite: NEGATIVE
Protein, ur: NEGATIVE mg/dL
Specific Gravity, Urine: 1.015 (ref 1.005–1.030)
pH: 7 (ref 5.0–8.0)

## 2019-07-05 LAB — WET PREP, GENITAL
Sperm: NONE SEEN
Yeast Wet Prep HPF POC: NONE SEEN

## 2019-07-05 LAB — PREGNANCY, URINE: Preg Test, Ur: NEGATIVE

## 2019-07-05 LAB — HIV ANTIBODY (ROUTINE TESTING W REFLEX): HIV Screen 4th Generation wRfx: NONREACTIVE

## 2019-07-05 LAB — LIPASE, BLOOD: Lipase: 28 U/L (ref 11–51)

## 2019-07-05 MED ORDER — IOHEXOL 300 MG/ML  SOLN
100.0000 mL | Freq: Once | INTRAMUSCULAR | Status: AC | PRN
  Administered 2019-07-05: 100 mL via INTRAVENOUS

## 2019-07-05 MED ORDER — FLUCONAZOLE 150 MG PO TABS: 150.0000 mg | ORAL_TABLET | Freq: Every day | ORAL | 0 refills | Status: AC

## 2019-07-05 MED ORDER — METRONIDAZOLE 500 MG PO TABS: 500.0000 mg | ORAL_TABLET | Freq: Two times a day (BID) | ORAL | 0 refills | Status: AC

## 2019-07-05 MED ORDER — KETOROLAC TROMETHAMINE 15 MG/ML IJ SOLN
15.0000 mg | Freq: Once | INTRAMUSCULAR | Status: AC
  Administered 2019-07-05: 15 mg via INTRAVENOUS
  Filled 2019-07-05: qty 1

## 2019-07-05 NOTE — ED Triage Notes (Signed)
Pt states that she has been eating more dairy than normal reports bloating, constipation ( last BM yesterday), pain in lower abdomen "up into anus", and in left back. Pt denies blood in urine, reports increased frequency.

## 2019-07-05 NOTE — ED Provider Notes (Signed)
[place] EMERGENCY DEPARTMENT Provider Note   CSN: [id number] Arrival date & time: 07/05/19  [id number]     History Chief Complaint  Patient presents with  . Abdominal Pain    Tracey French is a 39 y.o. female.  The history is provided by the patient and medical records. No language interpreter was used.  Abdominal Pain  Tracey French is a 39 y.o. female who presents to the Emergency Department complaining of abdominal pain. She presents the emergency department complaining of two days of progressive lower abdominal pain and bloating. Pain is located across her entire lower abdomen. She also reports about one week of pain in her left flank. Initially the left flank pain responded to a pain patch. Now she has persistent pain despite using the medicated patch. She has associated lower abdominal bloating. She denies any fevers, nausea, vomiting, diarrhea, constipation, dysuria, vaginal discharge. No prior similar symptoms. She does have a history of prior surgery for kidney stone as well as tubal ligation. No prior similar symptoms.    Past Medical History:  Diagnosis Date  . Fibroids   . Kidney stone   . Seizures [facility])     Patient Active Problem List   Diagnosis Date Noted  . Mastalgia in female 04/16/2018    Past Surgical History:  Procedure Laterality Date  . INGUINAL HERNIA REPAIR    . KIDNEY STONE SURGERY    . TUBAL LIGATION       OB History   No obstetric history on file.     Family History  Problem Relation Age of Onset  . Hypertension Mother   . Hypertension Father   . Diabetes Father   . Diabetes Maternal Grandmother   . Diabetes Paternal Grandmother     Social History   Tobacco Use  . Smoking status: Current Every Day Smoker    Packs/day: 0.50    Types: Cigarettes  . Smokeless tobacco: Never Used  Substance Use Topics  . Alcohol use: Yes    Comment: occ  . Drug use: No    Home Medications Prior to Admission medications   Medication  Sig Start Date End Date Taking? Authorizing Provider  fluconazole (DIFLUCAN) 150 MG tablet Take 1 tablet (150 mg total) by mouth daily for 1 dose. 07/05/19 07/06/19  Quintella Reichert, MD  ibuprofen (ADVIL) 800 MG tablet Take 1 tablet (800 mg total) by mouth every 8 (eight) hours as needed. 01/19/19   Hayden Rasmussen, MD  methocarbamol (ROBAXIN) 750 MG tablet Take 1 tablet (750 mg total) by mouth every 6 (six) hours as needed for muscle spasms. 01/19/19   Hayden Rasmussen, MD  metroNIDAZOLE (FLAGYL) 500 MG tablet Take 1 tablet (500 mg total) by mouth 2 (two) times daily. 07/05/19   Quintella Reichert, MD    Allergies    Codeine  Review of Systems   Review of Systems  Gastrointestinal: Positive for abdominal pain.  All other systems reviewed and are negative.   Physical Exam Updated Vital Signs BP 112/77 (BP Location: Left Arm)   Pulse (!) 53   Temp 98 F (36.7 C) (Oral)   Resp 16   Ht 5\' 5"  (1.651 m)   Wt 67.7 kg   LMP 06/10/2019   SpO2 100%   BMI 24.84 kg/m   Physical Exam Vitals and nursing note reviewed.  Constitutional:      Appearance: She is well-developed.  HENT:     Head: Normocephalic and atraumatic.  Cardiovascular:  Rate and Rhythm: Normal rate and regular rhythm.     Heart sounds: No murmur.  Pulmonary:     Effort: Pulmonary effort is normal. No respiratory distress.     Breath sounds: Normal breath sounds.  Abdominal:     Palpations: Abdomen is soft.     Tenderness: There is no guarding or rebound.     Comments: Moderate tenderness in the right lower quadrant  Genitourinary:    Comments: Small to moderate white vaginal d/c.  Os closed.  No CMT Musculoskeletal:        General: No swelling or tenderness.  Skin:    General: Skin is warm and dry.  Neurological:     Mental Status: She is alert and oriented to person, place, and time.  Psychiatric:        Behavior: Behavior normal.     ED Results / Procedures / Treatments   Labs (all labs ordered are  listed, but only abnormal results are displayed) Labs Reviewed  WET PREP, GENITAL - Abnormal; Notable for the following components:      Result Value   Trich, Wet Prep PRESENT (*)    Clue Cells Wet Prep HPF POC PRESENT (*)    WBC, Wet Prep HPF POC MANY (*)    All other components within normal limits  COMPREHENSIVE METABOLIC PANEL - Abnormal; Notable for the following components:   Glucose, Bld 100 (*)    Calcium 8.5 (*)    All other components within normal limits  URINALYSIS, ROUTINE W REFLEX MICROSCOPIC - Abnormal; Notable for the following components:   APPearance HAZY (*)    All other components within normal limits  CBC WITH DIFFERENTIAL/PLATELET  PREGNANCY, URINE  LIPASE, BLOOD  RPR  HIV ANTIBODY (ROUTINE TESTING W REFLEX)  GC/CHLAMYDIA PROBE AMP ([place]) NOT AT [facility]    EKG None  Radiology CT Abdomen Pelvis W Contrast  Result Date: 07/05/2019 CLINICAL DATA:  Acute right lower quadrant abdominal pain. EXAM: CT ABDOMEN AND PELVIS WITH CONTRAST TECHNIQUE: Multidetector CT imaging of the abdomen and pelvis was performed using the standard protocol following bolus administration of intravenous contrast. CONTRAST:  185mL OMNIPAQUE IOHEXOL 300 MG/ML  SOLN COMPARISON:  January 19, 2019. FINDINGS: Lower chest: No acute abnormality. Hepatobiliary: No focal liver abnormality is seen. No gallstones, gallbladder wall thickening, or biliary dilatation. Pancreas: Unremarkable. No pancreatic ductal dilatation or surrounding inflammatory changes. Spleen: Normal in size without focal abnormality. Adrenals/Urinary Tract: Adrenal glands are unremarkable. Kidneys are normal, without renal calculi, focal lesion, or hydronephrosis. Bladder is unremarkable. Stomach/Bowel: Stomach is within normal limits. Appendix appears normal. No evidence of bowel wall thickening, distention, or inflammatory changes. Vascular/Lymphatic: No significant vascular findings are present. No enlarged abdominal or pelvic  lymph nodes. Reproductive: Uterus and bilateral adnexa are unremarkable. Other: No abdominal wall hernia or abnormality. No abdominopelvic ascites. Musculoskeletal: No acute or significant osseous findings. IMPRESSION: No abnormality seen in the abdomen or pelvis. Electronically Signed   By: Marijo Conception M.D.   On: 07/05/2019 09:37    Procedures Procedures (including critical care time)  Medications Ordered in ED Medications  iohexol (OMNIPAQUE) 300 MG/ML solution 100 mL (100 mLs Intravenous Contrast Given 07/05/19 0911)  ketorolac (TORADOL) 15 MG/ML injection 15 mg (15 mg Intravenous Given 07/05/19 1101)    ED Course  I have reviewed the triage vital signs and the nursing notes.  Pertinent labs & imaging results that were available during my care of the patient were reviewed by me and  considered in my medical decision making (see chart for details).    MDM Rules/Calculators/A&P                     Patient here for evaluation of lower abdominal pain. She does have tenderness on examination without. Milta Deiters findings. On examination she does have a small amount of white discharge. Pelvic exam is not consistent with PID, tubo ovarian abscess, torsion. CT abdomen pelvis is negative for appendicitis. Discussed with patient findings of trichomonas on her wet prep and discussed mechanism of transmission. Discussed treatment for trichomonas. Do not feel that trichomonas is causing all of her symptoms at this time. Also discussed home care for possible constipation as well. Discussed importance of health department follow-up as well as return precautions.  Final Clinical Impression(s) / ED Diagnoses Final diagnoses:  Pelvic pain  Trichomonas vaginalis (TV) infection    Rx / DC Orders ED Discharge Orders         Ordered    metroNIDAZOLE (FLAGYL) 500 MG tablet  2 times daily     07/05/19 1134    fluconazole (DIFLUCAN) 150 MG tablet  Daily     07/05/19 1134           Quintella Reichert,  MD 07/05/19 1150

## 2019-07-06 LAB — GC/CHLAMYDIA PROBE AMP (~~LOC~~) NOT AT ARMC
Chlamydia: NEGATIVE
Comment: NEGATIVE
Comment: NORMAL
Neisseria Gonorrhea: NEGATIVE

## 2019-07-06 LAB — RPR: RPR Ser Ql: NONREACTIVE

## 2020-08-06 ENCOUNTER — Other Ambulatory Visit: Payer: Self-pay

## 2020-08-06 ENCOUNTER — Encounter (HOSPITAL_BASED_OUTPATIENT_CLINIC_OR_DEPARTMENT_OTHER): Payer: Self-pay | Admitting: Emergency Medicine

## 2020-08-06 ENCOUNTER — Emergency Department (HOSPITAL_BASED_OUTPATIENT_CLINIC_OR_DEPARTMENT_OTHER)
Admission: EM | Admit: 2020-08-06 | Discharge: 2020-08-06 | Disposition: A | Discharge status: Home / Self Care | Payer: Medicaid Other | Attending: Emergency Medicine | Admitting: Emergency Medicine

## 2020-08-06 ENCOUNTER — Emergency Department (HOSPITAL_BASED_OUTPATIENT_CLINIC_OR_DEPARTMENT_OTHER): Payer: Medicaid Other

## 2020-08-06 DIAGNOSIS — M722 Plantar fascial fibromatosis: Secondary | ICD-10-CM | POA: Diagnosis not present

## 2020-08-06 DIAGNOSIS — F17210 Nicotine dependence, cigarettes, uncomplicated: Secondary | ICD-10-CM | POA: Insufficient documentation

## 2020-08-06 DIAGNOSIS — M79672 Pain in left foot: Secondary | ICD-10-CM | POA: Diagnosis present

## 2020-08-06 MED ORDER — NAPROXEN 500 MG PO TABS: 500.0000 mg | ORAL_TABLET | Freq: Two times a day (BID) | ORAL | 0 refills | Status: AC

## 2020-08-06 MED ORDER — NAPROXEN 500 MG PO TABS: 500.0000 mg | ORAL_TABLET | Freq: Two times a day (BID) | ORAL | 0 refills | Status: DC

## 2020-08-06 NOTE — ED Provider Notes (Signed)
[place] EMERGENCY DEPARTMENT Provider Note   CSN: [id number] Arrival date & time: 08/06/20  6314     History Chief Complaint  Patient presents with  . Foot Pain    Tracey French is a 40 y.o. female.  The history is provided by the patient.  Foot Pain This is a new problem. Episode onset: 2-3 weeks. The problem occurs constantly. The problem has been gradually worsening. Associated symptoms comments: Pain in the left great toe and in the arch of the left foot.  No know trauma.  On her feet all day at work but does wear a tennis shoe.  Pain is worse with walking/standing or flexing the foot or left great toe.  Sometimes it will swell by the end of the day.  No prior sx prior to this. The symptoms are aggravated by walking (streching). The symptoms are relieved by rest. She has tried nothing for the symptoms. The treatment provided no relief.       Past Medical History:  Diagnosis Date  . Fibroids   . Kidney stone   . Seizures [facility])     Patient Active Problem List   Diagnosis Date Noted  . Mastalgia in female 04/16/2018    Past Surgical History:  Procedure Laterality Date  . INGUINAL HERNIA REPAIR    . KIDNEY STONE SURGERY    . TUBAL LIGATION       OB History   No obstetric history on file.     Family History  Problem Relation Age of Onset  . Hypertension Mother   . Hypertension Father   . Diabetes Father   . Diabetes Maternal Grandmother   . Diabetes Paternal Grandmother     Social History   Tobacco Use  . Smoking status: Current Every Day Smoker    Packs/day: 0.50    Types: Cigarettes  . Smokeless tobacco: Never Used  Vaping Use  . Vaping Use: Never used  Substance Use Topics  . Alcohol use: Yes    Comment: occ  . Drug use: No    Home Medications Prior to Admission medications   Medication Sig Start Date End Date Taking? Authorizing Provider  ibuprofen (ADVIL) 800 MG tablet Take 1 tablet (800 mg total) by mouth every 8 (eight)  hours as needed. 01/19/19   Hayden Rasmussen, MD  methocarbamol (ROBAXIN) 750 MG tablet Take 1 tablet (750 mg total) by mouth every 6 (six) hours as needed for muscle spasms. 01/19/19   Hayden Rasmussen, MD  metroNIDAZOLE (FLAGYL) 500 MG tablet Take 1 tablet (500 mg total) by mouth 2 (two) times daily. 07/05/19   Quintella Reichert, MD    Allergies    Codeine  Review of Systems   Review of Systems  All other systems reviewed and are negative.   Physical Exam Updated Vital Signs BP (P) 119/70 (BP Location: Right Arm)   Pulse (!) (P) 58   Temp (P) 98.7 F (37.1 C) (Oral)   Resp (P) 18   Ht 5\' 5"  (1.651 m)   Wt 74.8 kg   LMP 07/05/2020   SpO2 (P) 100%   BMI 27.46 kg/m   Physical Exam Vitals and nursing note reviewed.  Constitutional:      Appearance: Normal appearance.  HENT:     Head: Normocephalic.  Eyes:     Pupils: Pupils are equal, round, and reactive to light.  Cardiovascular:     Rate and Rhythm: Normal rate.     Pulses: Normal pulses.  Pulmonary:     Effort: Pulmonary effort is normal.  Musculoskeletal:        General: Tenderness present.     Left ankle: Normal.       Feet:  Skin:    General: Skin is warm and dry.  Neurological:     Mental Status: She is alert and oriented to person, place, and time. Mental status is at baseline.  Psychiatric:        Mood and Affect: Mood normal.        Behavior: Behavior normal.     ED Results / Procedures / Treatments   Labs (all labs ordered are listed, but only abnormal results are displayed) Labs Reviewed - No data to display  EKG None  Radiology DG Foot Complete Left  Result Date: 08/06/2020 CLINICAL DATA:  Left foot pain for 1 week from the great toe down the foot. Hurts to walk. EXAM: LEFT FOOT - COMPLETE 3+ VIEW COMPARISON:  None. FINDINGS: There is no evidence of fracture or dislocation. There is no evidence of arthropathy or other focal bone abnormality. Soft tissues are unremarkable. IMPRESSION:  Negative. Electronically Signed   By: Lajean Manes M.D.   On: 08/06/2020 10:33    Procedures Procedures   Medications Ordered in ED Medications - No data to display  ED Course  I have reviewed the triage vital signs and the nursing notes.  Pertinent labs & imaging results that were available during my care of the patient were reviewed by me and considered in my medical decision making (see chart for details).    MDM Rules/Calculators/A&P                          Pt with foot pain now worsening over the last 3 weeks.  Pain most severe in the arch and consistent with plantar fasciitis but also pain in the left first MTP joint.  NO swelling or signs concerning for gout or infection at this time.  No hx of trauma.  Pt does stand all day at work and does have flat feet.  Does not wear arch supports and also wears crocs a fair amount.  Plain films neg.  Will treat for plantar fasciitis but also concerned for a degree of tendonitis of the left great toe.  Pt given naproxen and arch supports with follow up.  MDM Number of Diagnoses or Management Options   Amount and/or Complexity of Data Reviewed Tests in the radiology section of CPT: ordered and reviewed Independent visualization of images, tracings, or specimens: yes    Final Clinical Impression(s) / ED Diagnoses Final diagnoses:  Plantar fasciitis of left foot    Rx / DC Orders ED Discharge Orders         Ordered    naproxen (NAPROSYN) 500 MG tablet  2 times daily,   Status:  Discontinued        08/06/20 1101    naproxen (NAPROSYN) 500 MG tablet  2 times daily        08/06/20 1101           Blanchie Dessert, MD 08/06/20 1102

## 2020-08-06 NOTE — Discharge Instructions (Signed)
Get a good arch support and wear that with your shoe at all times.  No barefeet, flip flops, or crocs.  You can also try lidocaine patch or voltaren gel for pain.

## 2020-08-06 NOTE — ED Triage Notes (Signed)
Left foot pain x 1 week,  Big toe down foot , hurts to walk on it  And stand on it   Big tooe hurts the worse
